# Patient Record
Sex: Female | Born: 1965 | Race: White | Hispanic: No | Marital: Married | Smoking: Former smoker
Health system: Southern US, Community
[De-identification: ages and names within clinical notes are randomized; demographics above are authoritative.]

## PROBLEM LIST (undated history)

## (undated) DIAGNOSIS — E039 Hypothyroidism, unspecified: Secondary | ICD-10-CM

## (undated) DIAGNOSIS — E785 Hyperlipidemia, unspecified: Secondary | ICD-10-CM

## (undated) DIAGNOSIS — K219 Gastro-esophageal reflux disease without esophagitis: Secondary | ICD-10-CM

## (undated) DIAGNOSIS — I459 Conduction disorder, unspecified: Secondary | ICD-10-CM

## (undated) DIAGNOSIS — I441 Atrioventricular block, second degree: Secondary | ICD-10-CM

## (undated) DIAGNOSIS — F32A Depression, unspecified: Secondary | ICD-10-CM

## (undated) HISTORY — PX: OTHER SURGICAL HISTORY: SHX169

## (undated) HISTORY — PX: ABDOMINAL HYSTERECTOMY: SHX81

## (undated) HISTORY — DX: Hyperlipidemia, unspecified: E78.5

## (undated) HISTORY — DX: Atrioventricular block, second degree: I44.1

## (undated) HISTORY — DX: Gastro-esophageal reflux disease without esophagitis: K21.9

## (undated) HISTORY — DX: Depression, unspecified: F32.A

---

## 1999-09-09 ENCOUNTER — Other Ambulatory Visit: Admission: RE | Admit: 1999-09-09 | Discharge: 1999-09-09 | Payer: Self-pay | Admitting: Family Medicine

## 2000-10-24 ENCOUNTER — Other Ambulatory Visit: Admission: RE | Admit: 2000-10-24 | Discharge: 2000-10-24 | Payer: Self-pay | Admitting: Family Medicine

## 2005-10-13 ENCOUNTER — Ambulatory Visit (HOSPITAL_COMMUNITY): Admission: RE | Admit: 2005-10-13 | Discharge: 2005-10-13 | Payer: Self-pay | Admitting: Obstetrics and Gynecology

## 2007-07-23 ENCOUNTER — Ambulatory Visit (HOSPITAL_COMMUNITY): Admission: RE | Admit: 2007-07-23 | Discharge: 2007-07-24 | Payer: Self-pay | Admitting: Obstetrics & Gynecology

## 2007-07-23 ENCOUNTER — Encounter (INDEPENDENT_AMBULATORY_CARE_PROVIDER_SITE_OTHER): Payer: Self-pay | Admitting: Obstetrics & Gynecology

## 2011-03-07 NOTE — Op Note (Signed)
NAMEBROOKS, STOTZ                 ACCOUNT NO.:  [id number]   MEDICAL RECORD NO.:  [id number]          PATIENT TYPE:  AMB   LOCATION:  DAY                          FACILITY:  [facility]   PHYSICIAN:  Genia Del, M.D.DATE OF BIRTH:  07-Oct-1966   DATE OF PROCEDURE:  07/23/2007  DATE OF DISCHARGE:                               OPERATIVE REPORT   PREOPERATIVE DIAGNOSIS:  Left pelvic pain with history of endometriosis  and uterine myoma.   POSTOPERATIVE DIAGNOSIS:  Left pelvic pain with history of endometriosis  and uterine myoma.   PROCEDURE:  Total laparoscopy hysterectomy with left salpingo-  oophorectomy assisted with Da vinci robot.   SURGEON:  Dr. Genia Del.   ASSISTANT:  Dr. Marina Gravel.   PROCEDURE:  Under general anesthesia with endotracheal intubation, the  patient is in lithotomy position.  She is prepped with Betadine on the  abdominal, suprapubic, vulvar and vaginal areas and draped as usual.  The bladder catheter is already in place and the patient just had a  sling procedure done by Dr. Sherron Monday.  We did a vaginal exam which  reveals an anteverted uterus, normal volume, mobile, no adnexal mass.  We insert the weighted speculum and grasped the cervix with the  tenaculum.  The Koh ring with the Rumi are put in place as usual.  We  removed the weighted speculum.  We then go to abdominal time.  We make a  supraumbilical incision with a scalpel over 1.5 cm after infiltrating  with Marcaine 0.25% plain.  We do an open laparoscopy using the Hasson,  the camera is put at that level.  The intra-abdominal and intrapelvic  cavities are inspected.  No adhesion is present.  We have therefore put  the other trocar as usual under direct vision.  Three robotic ports are  put in and an assistant port in a W configuration.  We then dock the  robot and put the instruments in.  A shear scissor is used in the right  arm.  A fenestrated bipolar in the left arm.  The 4th  robotic arm has a  Cobra.  We start at the console.  The ureters are well identified on  both sides and are in normal anatomic position.  We cauterized and  section the left infundibulopelvic ligament.  We then come close to the  uterus.  We cauterize and section the left round ligament and follow it  down the left side of the uterus.  We opened the anterior visceral  peritoneum and reclined the bladder downward.  We cauterize the left  uterine artery.  We then go on the right side.  The right ovary and  tubes are normal to inspection.  The decision was therefore taken to  keep them the patient being premenopausal.  We cauterize and section the  right tube proximally.  We cauterize and sectioned the right utero-  ovarian ligament.  The right round ligament and continued downward on  the right lateral side of the uterus.  We complete the opening of the  visceral anterior peritoneum and bring the bladder  down further.  We  then skeletonized the right uterine artery.  We cauterized it and  section it.  We then go back on the left side and re-cauterize and  sectioned the left uterine artery.  The uterus is blanching.  We then  pushed the [facility] ring further and start anteriorly with the shear  scissors, opening the anterior upper vagina and we go circumferentially  all around the [place] ring to detach the uterus completely.  We then bring  the uterus out through the vagina with the left ovary and tube.  This  specimen is sent to pathology.  We then used the occluder in the vagina  to preserve the pneumoperitoneum.  We change instruments.  We used the  cutting needle driver on the right-hand, the usual needle driver on the  left hand and the fenestrated bipolar in the fourth arm.  We used Vicryl  0 and figure-of-eight to close the vaginal vault.  Closure is good and  hemostasis is adequate.  We irrigate and suction the abdominopelvic  cavities.  We then removed the robotic instruments.  We undocked  the  robot.  We reversed the deep Trendelenburg and make sure that no extra  fluid was present in the pelvic cavity.  A last irrigation and suction  was done.  We then removed all instruments and all ports under direct  vision.  We close the supraumbilical incision with the pursestring  stitch that was already at the aponeurosis attaching that suture.  We  then closed the skin with a Vicryl 4-0 at all incisions except the left  one which was very small and the skin was closed only with Dermabond at  that level.  Dermabond was put on all other skin incisions as well.  We  removed the occluder that was still there vaginally.  Hemostasis is  adequate.  The estimated blood loss was 50 mL.  No complications  occurred and the patient was brought to recovery room in good stable  status.  Note that she has received antibiotic prophylaxis at the  beginning of the case.      Genia Del, M.D.  Electronically Signed     ML/MEDQ  D:  07/23/2007  T:  07/23/2007  Job:  [id number]

## 2011-03-07 NOTE — Op Note (Signed)
Judith Clark, Judith Clark                 ACCOUNT NO.:  [id number]   MEDICAL RECORD NO.:  [id number]          PATIENT TYPE:  AMB   LOCATION:  DAY                          [facility]   PHYSICIAN:  Martina Sinner, MD DATE OF BIRTH:  [birth year]   DATE OF PROCEDURE:  07/23/2007  DATE OF DISCHARGE:                               OPERATIVE REPORT   PREOPERATIVE DIAGNOSIS:  Stress incontinence.   POSTOPERATIVE DIAGNOSIS:  Stress incontinence.   PROCEDURE:  Sling cystourethropexy (SPARC plus cystoscopy).   Judith Clark has mixed stress and urge incontinence.  She consented to a  sling cystourethropexy.   The patient was prepped and draped for a robotic-assisted hysterectomy.  Extra care was taken with leg positioning to minimize the risk of  compartment syndrome, neuropathy, and DVT.   I initially made two 1-cm incisions 1.5 cm lateral to the midline one  fingerbreadth above the symphysis pubis.  I made a 2-cm incision  underlying the midurethra.  I used 7 mL of a lidocaine epinephrine  mixture.  I made suburethral incision with a thick pubocervical flap and  dissected with Strully scissors to the urethrovesical angle bilaterally.   With the bladder emptied, I passed the [facility] needle on top of and along  the back of the symphysis pubis parallel to the midline on the pulp of  my index finger bilaterally.  I then cystoscoped the patient.  There was  no entrance or injury to the bladder or urethra.  There was efflux of  indigo carmine from both ureteral orifices.  There was no deflection of  the bladder when I wiggled the SPARC needles from side to side.   With the bladder empty, I attached the [facility] sling and brought it up  through the retropubic space.  I cut below the blue dots and removed the  sheath.  I was very happy with the tension and position of the sling.  There was nice hypermobility in the midline as well as laterally.  There  was no spring back effect.   After  irrigating, I closed the anterior vaginal wall incision with  running 2-0 Vicryl on a CT-1 needle followed by 2 interrupted sutures.  I cut the mesh sling below the skin level and closed the skin with 4-0  Vicryl after irrigating.  I then used Dermabond.   Total blood loss was less than 30 mL.  The procedure was well-tolerated.  A light packing was placed while Dr. Seymour Bars was preparing for the  robotic-assisted hysterectomy.   Judith Clark will be followed as per protocol.           ______________________________  Martina Sinner, MD  Electronically Signed     SAM/MEDQ  D:  07/23/2007  T:  07/23/2007  Job:  [id number]

## 2011-03-10 NOTE — H&P (Signed)
NAMEEMER, ONNEN                 ACCOUNT NO.:  [id number]   MEDICAL RECORD NO.:  [id number]          PATIENT TYPE:  AMB   LOCATION:  SDC                           FACILITY:  [facility]   PHYSICIAN:  Richardean Sale, M.D.   DATE OF BIRTH:  09-Feb-1966   DATE OF ADMISSION:  10/13/2005  DATE OF DISCHARGE:                                HISTORY & PHYSICAL   PREOPERATIVE DIAGNOSES:  Pelvic pain.   HISTORY OF PRESENT ILLNESS:  This is a 45 year old gravida 3, para 2 white  female who has a history of progressively worsening pain with menses and  with intercourse.  Patient also has a questionable history of interstitial  cystitis and she presents today for surgical evaluation.  Patient's symptoms  are such that she now has pain throughout the majority days of the month  with only five days of the month that she does not have some sort of lower  pelvic discomfort.  Pain is significantly worsened with her menses as well  as with intercourse and she is no longer able to tolerate sexual activity.  She has been tried on oral contraceptive pills on the past, but discontinued  these secondary to side effects and she has declined an empiric trial of  Lupron.  She presents today for diagnostic laparoscopy as well as cystoscopy  and hydrodistention to be performed by Dr. McDiarmid to evaluate for  interstitial cystitis.   PAST OBSTETRIC HISTORY:  Vaginal delivery x2.   PAST SURGICAL HISTORY:  Bilateral tubal ligation.   PAST MEDICAL HISTORY:  Urinary incontinence, possible interstitial cystitis  and history of depression.   PAST GYNECOLOGICAL HISTORY:  No history of abnormal Pap smears or sexually  transmitted infections.  Menses are regular beginning approximately age 89,  last four days with medium flow.  No intramenstrual bleeding.   FAMILY HISTORY:  Positive for hypertension in her mother and grandmother.  Mother had cervical cancer.  No breast, ovarian, uterine, or colon cancer.   SOCIAL  HISTORY:  Remote history of marijuana.  Denies any tobacco, alcohol,  or illicit drug use presently.   MEDICATIONS:  Aciphex, Enablex.   ALLERGIES:  MOLD.   PHYSICAL EXAMINATION:  VITAL SIGNS:  She is afebrile.  Vital signs are  stable.  Weight 138.  Height 5 feet 3-1/2 inches.  GENERAL:  She is a well-developed, well-nourished white female who is in no  acute distress.  HEART:  Regular rate and rhythm.  LUNGS:  Clear to auscultation bilaterally.  ABDOMEN:  Soft, nontender, nondistended with no palpable masses.  Liver and  spleen are normal.  There is no hernia.  EXTREMITIES:  No clubbing, cyanosis, edema.  NEUROLOGIC:  Nonfocal.  PELVIC:  External genitalia are within normal limits without lesions.  The  vagina is pink, moist, and rugated with good support.  Cervix:  Parous with  no lesions.  No cervical motion tenderness.  Uterus is anteverted, midline,  mobile with no masses.  Adnexa:  No masses, but mildly tender to palpation  over both adnexa.  No uterosacral nodularity palpated.  Pelvic ultrasound  shows  uterus with two small uterine fibroids maximum diameter 2.5 cm.  No  submucosal or subserosal component.   ASSESSMENT:  45 year old gravida 3, para 2-0-0-2 white female with  progressively worsening pelvic pain, dysmenorrhea, and dyspareunia as well  as urinary incontinence.   PLAN:  Will proceed with diagnostic laparoscopy and possible fulguration of  endometriosis should endometriosis be identified at the time of procedure.  In addition, Dr. McDiarmid will perform cystoscopy and bladder  hydrodistention to evaluate for interstitial cystitis.  I have reviewed with  the patient that operative findings at the time of this procedure may be  entirely within normal limits and may not have an explanation for her pelvic  pain.  I reviewed the risks, benefits, and alternatives of the procedure  with the patient in detail.  We have discussed risks which include, but are  not  limited to, hemorrhage requiring transfusion, infection, injury to the  bowel, the bladder, the ureters or other organs which could require  additional surgery either at the time of this procedure or in the future,  reviewed the risks of anesthesia-related complications including DVT,  pulmonary embolus, and even death.  Patient voices understanding of all  these above risks and desires to proceed.  Informed consent has been  obtained.      Richardean Sale, M.D.  Electronically Signed     JW/MEDQ  D:  10/12/2005  T:  10/13/2005  Job:  [id number]

## 2011-03-10 NOTE — Op Note (Signed)
Judith Clark, Judith Clark                 ACCOUNT NO.:  [id number]   MEDICAL RECORD NO.:  [id number]          PATIENT TYPE:  AMB   LOCATION:  SDC                           FACILITY:  [facility]   PHYSICIAN:  Richardean Sale, M.D.   DATE OF BIRTH:  1966-02-03   DATE OF PROCEDURE:  10/13/2005  DATE OF DISCHARGE:                                 OPERATIVE REPORT   PREOPERATIVE DIAGNOSES:  1.  Pelvic pain.  2.  Dysmenorrhea.  3.  Dyspareunia.   POSTOPERATIVE DIAGNOSES:  1.  Pelvic pain.  2.  Dysmenorrhea.  3.  Dyspareunia.  4.  Endometriosis.   OPERATION/PROCEDURE:  Diagnostic laparoscopy with fulguration of  endometriosis.   SURGEON:  Richardean Sale, M.D.   ASSISTANT:  None.   COMPLICATIONS:  None.   ESTIMATED BLOOD LOSS:  Minimal.   ANESTHESIA:  General.   SURGICAL FINDINGS:  Endometrial implants of the left uterosacral ligament  with moderate amount of scarring.  Normal-appearing ovaries bilaterally.  Normal fallopian tubes bilaterally with evidence of prior tubal ligation,  normal appendix, normal liver.  No endometrial implants in the posterior cul-  de-sac.  Single endometrial implant in the anterior cul-de-sac.   PATHOLOGY SPECIMENS:  None.   INDICATIONS:  This is a 44 year old gravida 3, para 2-0-0-2 white female who  has a history of progressively worsening pelvic pain, dysmenorrhea and  dyspareunia.  The patient has been tried on oral contraceptive pills but has  been unable to tolerate them secondary to side effects.  The patient  presents today for diagnostic laparoscopy.  In addition, the patient has had  trouble with frequent urination and has been evaluated by urology for  possible interstitial cystitis.  During today's procedure, Dr. McDiarmid is  planning cystoscopy with hydrodistention to evaluate for IC.  Prior to this  procedure, the risks, benefits and alternatives of laparoscopy were reviewed  with the patient in detail.  We discussed risks which include but are  not  limited to hemorrhage requiring transfusion, infection, injury to the bowel  or the bladder, the ureters or other organs that could require additional  surgery either at the time of this procedure or in the future.  I reviewed  risks of DVT, pulmonary embolus, and anesthesia-related complications.  The  patient voiced understanding of all the above and desired to proceed.  In  addition, I discussed with the patient that there is a possibility that  surgical findings may be entirely within normal limits or that endometriosis  may be encountered and if endometriosis is encountered, the plans today  would be for destruction of any endometrial implants with electrocautery  fulguration.  The patient voiced understanding of the above and desires to  proceed.  Informed consent was obtained before proceeding to the OR.   DESCRIPTION OF PROCEDURE:  The patient was taken to the operating room where  she was given a general anesthetic. She was then prepped and draped in the  usual sterile fashion with Betadine and Dr. McDiarmid performed his portion  of the procedure.  Once Dr. McDiarmid had completed his procedure, I entered  the room.  The abdomen was then prepped and draped and the pelvis was  reprepped.  Sterile drapes were applied.  Bimanual exam was performed.  The  uterus was midline, mobile with no obvious masses.  The adnexa was were  mobile with no masses.  Foley catheter was then placed.  The speculum was  then placed in the vagina and the [place] tenaculum was then introduced.  The  speculum was then removed and attention was then turned to the patient's  abdomen.  Plain Marcaine 0.5%, 5 mL, injected into the infraumbilical area  and an infraumbilical 10 mm skin incision was made with the scalpel.  This  was carried down to the fascia.  The fascia was then grasped between two  Kocher clamps and the fascia was then incised with Mayo scissors.  The  peritoneum was then identified and  grasped with two hemostats and was  incised.  The fascial incision was then secured with the pursestring Vicryl  suture and the Hasson trocar and sleeve were introduced.  The laparoscope  was then introduced and confirmed intra-abdominal placement.  CO2 gas was  then allowed to insufflate.  A second incision was made in the patient's  suprapubic area 2 cm above the pubic symphysis in the midline.  This was a 5  mm incision.  Marcaine was used in the subcutaneous tissue approximately 2  mL prior to making the incision.  Through this incision the 5 mm trocar and  sleeve were introduced directly.  A blunt probe was then introduced and the  pelvis was explored with the findings noted above.  Abnormal areas included  the left uterosacral ligaments and a small endometrial implant in the  anterior cul-de-sac.  At this point a third port was then made in the  patient's left lower quadrant in an area that was free of the inferior  epigastric vessels.  The bipolar cautery was then introduced and the  endometrial implants were fulgurated.  The ureter was noted to be coursing  in its normal anatomic position and was at least 2 cm away from the  endometrial implant on the left uterosacral ligament.  At that point once  fulguration had been completed, the pelvis was inspected.  There was no  evidence of any bleeding.  Plain Marcaine 0.5%, 10 mL, introduced into the  peritoneal cavity over the areas that were cauterized for an additional  postoperative pain relief and the procedure was then terminated.  CO2 gas  was allowed to escape.  The 5 mm trocars were removed under direct  visualization and the laparoscope and Hasson trocar were removed.  The  umbilical fascial incision was then closed by tying the previously placed  pursestring Vicryl suture and the skin of the umbilical incision was closed  with a 4-0 Vicryl in subcuticular fashion and the two other incisions were closed with Dermabond.  The [place]ka  tenaculum was removed from the patient's  cervix and the Foley catheter was removed.  The patient tolerated the  procedure very well. There were no complications.  All sponge, lap, and  needle counts were correct x2.  She was taken to the recovery room awake and  in stable condition.      Richardean Sale, M.D.  Electronically Signed     JW/MEDQ  D:  10/13/2005  T:  10/14/2005  Job:  [id number]

## 2011-03-10 NOTE — Op Note (Signed)
NAMESHALAUNDA, WEATHERHOLTZ                 ACCOUNT NO.:  [id number]   MEDICAL RECORD NO.:  [id number]          PATIENT TYPE:  AMB   LOCATION:  SDC                           FACILITY:  [facility]   PHYSICIAN:  Martina Sinner, MD DATE OF BIRTH:  May 10, 1966   DATE OF PROCEDURE:  10/13/2005  DATE OF DISCHARGE:                                 OPERATIVE REPORT   PREOPERATIVE DIAGNOSIS:  Pelvic pain.   POSTOPERATIVE DIAGNOSIS:  Pelvic pain.   OPERATION/PROCEDURE:  1.  Cystoscopy.  2.  Bladder hydrodistention.   INDICATIONS:  Mrs. Judith Clark has pelvic pain. She is going to have urodynamics on  January 15.  She also has marked frequency and mild mixed stress and urge  incontinence.  We are trying to sort out whether or not her pain is  gynecological or urologic.  She consented to a cystoscopy and  hydrodistention and laparoscopy by the Dr. Annabell Howells.   DESCRIPTION OF PROCEDURE:  Under general anesthesia, the patient was prepped  and draped in the usual fashion.  She was given IV ciprofloxacin prior to  the procedure.  She was hydrodistended to approximately 800 mL and pressor  of approximately 80 cm of water.  Sterile water was utilized.  Initially the  bladder mucosa and trigone were normal.  I emptied the bladder.  I  reinspected it twice and she had no glomerulations or finding in keeping  with a diagnosis of interstitial cystitis.  The bladder was emptied.  She  will be covered with antibiotics postoperatively, given Phenergan as well as  pain medications.           ______________________________  Martina Sinner, MD  Electronically Signed     SAM/MEDQ  D:  10/13/2005  T:  10/14/2005  Job:  [id number]

## 2011-08-03 LAB — PROTIME-INR: Prothrombin Time: 13.8

## 2011-08-03 LAB — COMPREHENSIVE METABOLIC PANEL
ALT: 34
AST: 23
Albumin: 3.9
Alkaline Phosphatase: 67
Glucose, Bld: 109 — ABNORMAL HIGH
Potassium: 3.7
Sodium: 141
Total Protein: 7

## 2011-08-03 LAB — TYPE AND SCREEN: Antibody Screen: NEGATIVE

## 2011-08-03 LAB — PREGNANCY, URINE: Preg Test, Ur: NEGATIVE

## 2011-08-03 LAB — CBC
Hemoglobin: 12
MCV: 85.1
Platelets: 275
RBC: 4.16
RDW: 14
WBC: 16.5 — ABNORMAL HIGH

## 2011-08-03 LAB — ABO/RH: ABO/RH(D): B POS

## 2011-12-02 ENCOUNTER — Encounter (HOSPITAL_COMMUNITY): Payer: Self-pay | Admitting: *Deleted

## 2011-12-02 ENCOUNTER — Emergency Department (HOSPITAL_COMMUNITY)
Admission: EM | Admit: 2011-12-02 | Discharge: 2011-12-02 | Disposition: A | Discharge status: Home / Self Care | Payer: Self-pay | Source: Home / Self Care | Attending: Emergency Medicine | Admitting: Emergency Medicine

## 2011-12-02 DIAGNOSIS — N390 Urinary tract infection, site not specified: Secondary | ICD-10-CM

## 2011-12-02 DIAGNOSIS — R109 Unspecified abdominal pain: Secondary | ICD-10-CM

## 2011-12-02 DIAGNOSIS — E039 Hypothyroidism, unspecified: Secondary | ICD-10-CM | POA: Insufficient documentation

## 2011-12-02 HISTORY — DX: Hypothyroidism, unspecified: E03.9

## 2011-12-02 LAB — POCT URINALYSIS DIP (DEVICE)
Bilirubin Urine: NEGATIVE
Glucose, UA: NEGATIVE mg/dL
Nitrite: NEGATIVE

## 2011-12-02 MED ORDER — CEFTRIAXONE SODIUM 1 G IJ SOLR
1.0000 g | Freq: Once | INTRAMUSCULAR | Status: AC
  Administered 2011-12-02: 1 g via INTRAMUSCULAR

## 2011-12-02 MED ORDER — LIDOCAINE HCL (PF) 1 % IJ SOLN
INTRAMUSCULAR | Status: AC
  Filled 2011-12-02: qty 5

## 2011-12-02 MED ORDER — CEFTRIAXONE SODIUM 1 G IJ SOLR: 1.0000 g | Freq: Once | INTRAMUSCULAR | Status: AC

## 2011-12-02 MED ORDER — CEFTRIAXONE SODIUM 1 G IJ SOLR
INTRAMUSCULAR | Status: AC
  Filled 2011-12-02: qty 10

## 2011-12-02 MED ORDER — CEPHALEXIN 500 MG PO CAPS: 500.0000 mg | ORAL_CAPSULE | Freq: Four times a day (QID) | ORAL | Status: AC

## 2011-12-02 NOTE — ED Notes (Signed)
Co burning with urination along with urgency x 1 wk, has been taking uricalm, otc, starting yesterday with rash to arms, legs, buttocks area with itching and burning at rash site, states this is only new med she has taken

## 2011-12-02 NOTE — ED Provider Notes (Signed)
History     CSN: [id number]  Arrival date & time 12/02/11  0908   First MD Initiated Contact with Patient 12/02/11 [phone]      Chief Complaint  Patient presents with  . Urinary Tract Infection    (Consider location/radiation/quality/duration/timing/severity/associated sxs/prior treatment) HPI Comments: Judith Clark presents to urgent care complaining of burning with urination along with urgency for about a week try you her account over-the-counter with no improvement and believes suspects that secondarily to that developed a rash both of her wrists and ankle area and outer aspect of the toes itching and burning seem to be subsiding now.  Patient also describes that she vomited one or 2 times during the course of the week. Perhaps perhaps unaware if she has perceive any fevers. Describe some suprapubic discomfort.  Patient is a 46 y.o. female presenting with urinary tract infection. The history is provided by the patient.  Urinary Tract Infection The current episode started more than 1 week ago. The problem occurs constantly. The problem has been gradually worsening. Associated symptoms include abdominal pain. Pertinent negatives include no shortness of breath.    Past Medical History  Diagnosis Date  . Hypothyroidism   . Hypothyroidism     Past Surgical History  Procedure Date  . Abdominal hysterectomy   . Bladder tacking     History reviewed. No pertinent family history.  History  Substance Use Topics  . Smoking status: Never Smoker   . Smokeless tobacco: Not on file  . Alcohol Use: Yes    OB History    Grav Para Term Preterm Abortions TAB SAB Ect Mult Living   3 3              Review of Systems  Constitutional: Positive for fever, chills, activity change, appetite change and fatigue.  Respiratory: Negative for shortness of breath.   Gastrointestinal: Positive for abdominal pain.  Genitourinary: Positive for dysuria, urgency, flank pain, difficulty urinating and pelvic  pain. Negative for hematuria and vaginal pain.    Allergies  Review of patient's allergies indicates no known allergies.  Home Medications   Current Outpatient Rx  Name Route Sig Dispense Refill  . LEVOTHYROXINE SODIUM 50 MCG PO TABS Oral Take 50 mcg by mouth daily.    Marland Kitchen CEFTRIAXONE SODIUM 1 G IJ SOLR Intramuscular Inject 1 g into the muscle once. 1 each 0  . CEPHALEXIN 500 MG PO CAPS Oral Take 1 capsule (500 mg total) by mouth 4 (four) times daily. 40 capsule 0    BP 128/82  Pulse 98  Temp(Src) 97.7 F (36.5 C) (Oral)  Resp 20  Physical Exam  Constitutional: She appears well-developed and well-nourished. No distress.  HENT:  Head: Normocephalic.  Neck: Neck supple.  Abdominal: Soft. There is tenderness. There is CVA tenderness. There is no rebound and no guarding.    Skin: No rash noted. No erythema.    ED Course  Procedures (including critical care time)  Labs Reviewed  POCT URINALYSIS DIP (DEVICE) - Abnormal; Notable for the following:    Hgb urine dipstick TRACE (*)    Leukocytes, UA LARGE (*) Biochemical Testing Only. Please order routine urinalysis from main lab if confirmatory testing is needed.   All other components within normal limits  POCT PREGNANCY, URINE  URINE CULTURE   No results found.   1. Urinary tract infection   2. Flank pain       MDM  Patient looks comfortable with normal signs and afebrile describe symptoms for  approximately one week described on my review of system that she vomited once or twice and during exam left flank pain was reproducible along with suprapubic tenderness. Suspect patient might be an early stage of [place] [employer] she also took about 3 doses URI CALM and described developed a bilateral wrist routed it every ERY THEMATOUS type rash that now seem to have faded completely similar rash to both pretibial areas. Will provide patient with aggressive is antibiotic dose while at [facility] with ceftriaxone so has been administered in a  course of Keflex for 10 days. Urine cultures have been ordered today patient has been clearly instructed that if temperatures above 100.4 further vomiting constant or exacerbated left flank pain despite 3 or 4 doses of Keflex to go to the emergency department for further evaluation and treatment of Pyelonephritis        Jimmie Molly, MD 12/02/11 1217

## 2011-12-03 LAB — URINE CULTURE
Culture  Setup Time: 201302091709
Special Requests: NORMAL

## 2012-06-04 ENCOUNTER — Other Ambulatory Visit (HOSPITAL_COMMUNITY): Payer: Self-pay | Admitting: Family Medicine

## 2012-06-04 DIAGNOSIS — R1011 Right upper quadrant pain: Secondary | ICD-10-CM

## 2012-06-05 ENCOUNTER — Ambulatory Visit (HOSPITAL_COMMUNITY)
Admission: RE | Admit: 2012-06-05 | Discharge: 2012-06-05 | Disposition: A | Discharge status: Home / Self Care | Payer: 59 | Source: Ambulatory Visit | Attending: Family Medicine | Admitting: Family Medicine

## 2012-06-05 DIAGNOSIS — R110 Nausea: Secondary | ICD-10-CM | POA: Insufficient documentation

## 2012-06-05 DIAGNOSIS — R1011 Right upper quadrant pain: Secondary | ICD-10-CM

## 2012-07-16 ENCOUNTER — Other Ambulatory Visit (HOSPITAL_COMMUNITY): Payer: Self-pay | Admitting: Family Medicine

## 2012-07-16 DIAGNOSIS — R109 Unspecified abdominal pain: Secondary | ICD-10-CM

## 2012-08-07 ENCOUNTER — Encounter (HOSPITAL_COMMUNITY): Payer: 59

## 2012-08-16 ENCOUNTER — Ambulatory Visit (HOSPITAL_COMMUNITY)
Admission: RE | Admit: 2012-08-16 | Discharge: 2012-08-16 | Disposition: A | Discharge status: Home / Self Care | Payer: 59 | Source: Ambulatory Visit | Attending: Family Medicine | Admitting: Family Medicine

## 2012-08-16 DIAGNOSIS — R112 Nausea with vomiting, unspecified: Secondary | ICD-10-CM | POA: Insufficient documentation

## 2012-08-16 DIAGNOSIS — R1011 Right upper quadrant pain: Secondary | ICD-10-CM | POA: Insufficient documentation

## 2012-08-16 DIAGNOSIS — R109 Unspecified abdominal pain: Secondary | ICD-10-CM

## 2012-08-16 MED ORDER — SINCALIDE 5 MCG IJ SOLR
INTRAMUSCULAR | Status: AC
  Administered 2012-08-16: 3.4 ug via INTRAVENOUS
  Filled 2012-08-16: qty 5

## 2012-08-16 MED ORDER — TECHNETIUM TC 99M MEBROFENIN IV KIT
5.0000 | PACK | Freq: Once | INTRAVENOUS | Status: AC | PRN
  Administered 2012-08-16: 5 via INTRAVENOUS

## 2012-08-16 MED ORDER — SINCALIDE 5 MCG IJ SOLR
0.0200 ug/kg | Freq: Once | INTRAMUSCULAR | Status: AC
  Administered 2012-08-16: 3.4 ug via INTRAVENOUS

## 2013-07-05 IMAGING — NM NM HEPATO W/GB/PHARM/[PERSON_NAME]
4 series · 19 of 19 positions shown · non-contrast
Comparison: none

CLINICAL DATA: Right upper abdominal pain, nausea.  Negative
gallbladder ultrasound.

[he hepatobiliary · 3.43mm/px · 6 of 30 frames shown (1 of 4)]
[frame 3/30]
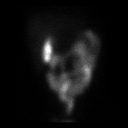
[frame 8/30]
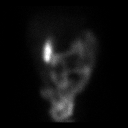
[frame 13/30]
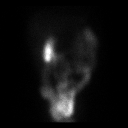
[frame 18/30]
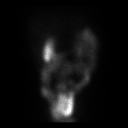
[frame 23/30]
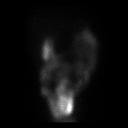
[frame 28/30]
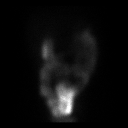

[he hepatobiliary · 3.43mm/px · 6 of 8 frames shown (2 of 4)]
[frame 1/8]
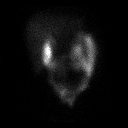
[frame 2/8]
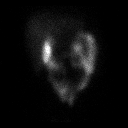
[frame 4/8]
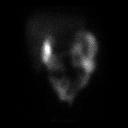
[frame 5/8]
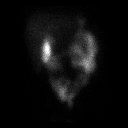
[frame 6/8]
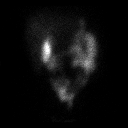
[frame 8/8]
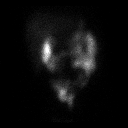

[he hepatobiliary · 3.43mm/px · 6 of 52 frames shown (3 of 4)]
[frame 5/52]
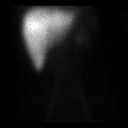
[frame 13/52]
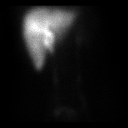
[frame 22/52]
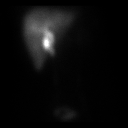
[frame 31/52]
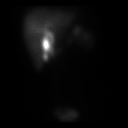
[frame 39/52]
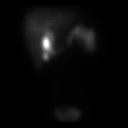
[frame 48/52]
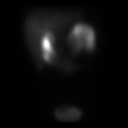

[he hepatobiliary · 1 of 1 slices shown (4 of 4)]
[im 1/1]
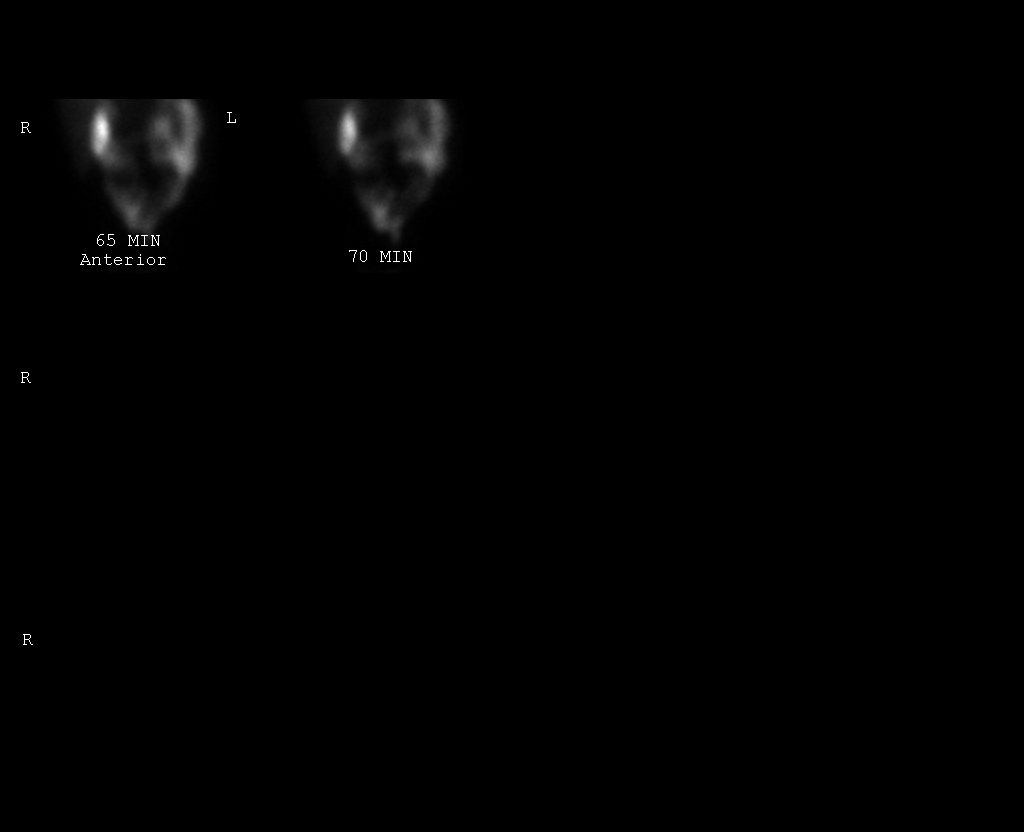

[19 of 19 positions shown; findings below may reference images not displayed]

HEPATOBILIARY SCINTIGRAPHY WITH EJECTION FRACTION

Anterior imaging afterQ.UmXi CcAA3 Choletec IV. There is prompt
clearance of the radiopharmaceutical from the blood pool. Timely
visualization of activity in central bile ducts, small bowel, and
gallbladder.
After 1 hour,1.4 mcg CCK was infused intravenously and imaging
continued. The patient describes mild pain with infusion. The
calculated gallbladder ejection fraction over 30 minutes is 52.6%
[(normal >30% at  thirty minutes (Ziessman et al., 9330 J. Nuclear
Med).]

IMPRESSION
1. Patency of cystic and common bile ducts.
2. Normal gallbladder ejection fraction.

## 2014-08-24 ENCOUNTER — Encounter (HOSPITAL_COMMUNITY): Payer: Self-pay | Admitting: *Deleted

## 2016-06-08 ENCOUNTER — Emergency Department (HOSPITAL_COMMUNITY): Payer: 59

## 2016-06-08 ENCOUNTER — Emergency Department (HOSPITAL_COMMUNITY)
Admission: EM | Admit: 2016-06-08 | Discharge: 2016-06-08 | Disposition: A | Discharge status: Home / Self Care | Payer: 59 | Attending: Emergency Medicine | Admitting: Emergency Medicine

## 2016-06-08 ENCOUNTER — Encounter (HOSPITAL_COMMUNITY): Payer: Self-pay | Admitting: Emergency Medicine

## 2016-06-08 DIAGNOSIS — Z79891 Long term (current) use of opiate analgesic: Secondary | ICD-10-CM | POA: Diagnosis not present

## 2016-06-08 DIAGNOSIS — E039 Hypothyroidism, unspecified: Secondary | ICD-10-CM | POA: Insufficient documentation

## 2016-06-08 DIAGNOSIS — R1011 Right upper quadrant pain: Secondary | ICD-10-CM | POA: Diagnosis present

## 2016-06-08 DIAGNOSIS — R112 Nausea with vomiting, unspecified: Secondary | ICD-10-CM | POA: Insufficient documentation

## 2016-06-08 DIAGNOSIS — Z79899 Other long term (current) drug therapy: Secondary | ICD-10-CM | POA: Insufficient documentation

## 2016-06-08 HISTORY — DX: Conduction disorder, unspecified: I45.9

## 2016-06-08 LAB — COMPREHENSIVE METABOLIC PANEL
ALT: 36 U/L (ref 14–54)
ANION GAP: 8 (ref 5–15)
AST: 24 U/L (ref 15–41)
Albumin: 4.4 g/dL (ref 3.5–5.0)
Alkaline Phosphatase: 71 U/L (ref 38–126)
BUN: 14 mg/dL (ref 6–20)
CALCIUM: 9.3 mg/dL (ref 8.9–10.3)
CHLORIDE: 107 mmol/L (ref 101–111)
CO2: 22 mmol/L (ref 22–32)
Creatinine, Ser: 0.9 mg/dL (ref 0.44–1.00)
Glucose, Bld: 94 mg/dL (ref 65–99)
Potassium: 3.4 mmol/L — ABNORMAL LOW (ref 3.5–5.1)
SODIUM: 137 mmol/L (ref 135–145)
Total Bilirubin: 0.6 mg/dL (ref 0.3–1.2)
Total Protein: 8 g/dL (ref 6.5–8.1)

## 2016-06-08 LAB — CBC WITH DIFFERENTIAL/PLATELET
BASOS ABS: 0 10*3/uL (ref 0.0–0.1)
Basophils Relative: 0 %
EOS ABS: 0.3 10*3/uL (ref 0.0–0.7)
EOS PCT: 2 %
HCT: 37.9 % (ref 36.0–46.0)
HEMOGLOBIN: 12.3 g/dL (ref 12.0–15.0)
LYMPHS ABS: 4 10*3/uL (ref 0.7–4.0)
LYMPHS PCT: 29 %
MCH: 27.3 pg (ref 26.0–34.0)
MCHC: 32.5 g/dL (ref 30.0–36.0)
MCV: 84 fL (ref 78.0–100.0)
Monocytes Absolute: 1 10*3/uL (ref 0.1–1.0)
Monocytes Relative: 7 %
NEUTROS PCT: 62 %
Neutro Abs: 8.3 10*3/uL — ABNORMAL HIGH (ref 1.7–7.7)
PLATELETS: 364 10*3/uL (ref 150–400)
RBC: 4.51 MIL/uL (ref 3.87–5.11)
RDW: 14.2 % (ref 11.5–15.5)
WBC: 13.6 10*3/uL — AB (ref 4.0–10.5)

## 2016-06-08 LAB — LIPASE, BLOOD: LIPASE: 35 U/L (ref 11–51)

## 2016-06-08 LAB — URINALYSIS, ROUTINE W REFLEX MICROSCOPIC
Bilirubin Urine: NEGATIVE
Glucose, UA: NEGATIVE mg/dL
HGB URINE DIPSTICK: NEGATIVE
Ketones, ur: NEGATIVE mg/dL
LEUKOCYTES UA: NEGATIVE
NITRITE: NEGATIVE
PROTEIN: NEGATIVE mg/dL
SPECIFIC GRAVITY, URINE: 1.008 (ref 1.005–1.030)
pH: 6.5 (ref 5.0–8.0)

## 2016-06-08 MED ORDER — BARIUM SULFATE 2.1 % PO SUSP
900.0000 mL | ORAL | Status: AC
  Administered 2016-06-08: 900 mL via ORAL

## 2016-06-08 MED ORDER — MORPHINE SULFATE (PF) 2 MG/ML IV SOLN
2.0000 mg | Freq: Once | INTRAVENOUS | Status: DC
  Filled 2016-06-08: qty 1

## 2016-06-08 MED ORDER — SODIUM CHLORIDE 0.9 % IV SOLN
1000.0000 mL | Freq: Once | INTRAVENOUS | Status: AC
  Administered 2016-06-08: 1000 mL via INTRAVENOUS

## 2016-06-08 MED ORDER — SODIUM CHLORIDE 0.9 % IV SOLN
1000.0000 mL | INTRAVENOUS | Status: DC
  Administered 2016-06-08: 1000 mL via INTRAVENOUS

## 2016-06-08 MED ORDER — ONDANSETRON 4 MG PO TBDP: 4.0000 mg | ORAL_TABLET | ORAL | 0 refills | Status: DC | PRN

## 2016-06-08 MED ORDER — FAMOTIDINE 20 MG PO TABS: 20.0000 mg | ORAL_TABLET | Freq: Two times a day (BID) | ORAL | 0 refills | Status: AC

## 2016-06-08 MED ORDER — ONDANSETRON HCL 4 MG/2ML IJ SOLN
4.0000 mg | Freq: Once | INTRAMUSCULAR | Status: AC
  Administered 2016-06-08: 4 mg via INTRAVENOUS
  Filled 2016-06-08: qty 2

## 2016-06-08 MED ORDER — FAMOTIDINE IN NACL 20-0.9 MG/50ML-% IV SOLN
20.0000 mg | Freq: Once | INTRAVENOUS | Status: AC
  Administered 2016-06-08: 20 mg via INTRAVENOUS
  Filled 2016-06-08: qty 50

## 2016-06-08 NOTE — ED Provider Notes (Signed)
[facility]-EMERGENCY DEPT Provider Note   CSN: [id number] Arrival date & time: 06/08/16  1325     History   Chief Complaint Chief Complaint  Patient presents with  . Abdominal Pain    HPI Judith Clark is a 50 y.o. female.  HPI Patient reports she has right upper quadrant pain that started 6 days ago. She reports is been waxing and waning severity. At times is been intensely severe. She has noted radiation up towards her right shoulder and neck intermittently. She also receives pain radiating into her back. Patient suspects this is gallbladder problems. She reports that she woke up in the middle the night Monday vomiting copiously. She also developed some very loose stool that was green in quality. Those symptoms have now abated. The patient has not had vomiting and diarrhea today. She has perceived chills and subjective fever. Patient does report a history of severe gastric reflux. Past Medical History:  Diagnosis Date  . Heart block   . Hypothyroidism   . Hypothyroidism     Patient Active Problem List   Diagnosis Date Noted  . Hypothyroidism     Past Surgical History:  Procedure Laterality Date  . ABDOMINAL HYSTERECTOMY    . bladder tacking      OB History    Gravida Para Term Preterm AB Living   3 3           SAB TAB Ectopic Multiple Live Births                   Home Medications    Prior to Admission medications   Medication Sig Start Date End Date Taking? Authorizing Provider  acetaminophen (TYLENOL) 500 MG tablet Take 1,000 mg by mouth every 6 (six) hours as needed for mild pain, moderate pain or headache.   Yes Historical Provider, MD  calcium carbonate (TUMS - DOSED IN MG ELEMENTAL CALCIUM) 500 MG chewable tablet Chew 1 tablet by mouth daily as needed for indigestion or heartburn.   Yes Historical Provider, MD  Multiple Vitamins-Minerals (MULTIVITAMIN ADULT PO) Take 1 tablet by mouth daily.   Yes Historical Provider, MD  SYNTHROID 75 MCG tablet Take 75 mcg by  mouth daily. 05/24/16  Yes Historical Provider, MD  famotidine (PEPCID) 20 MG tablet Take 1 tablet (20 mg total) by mouth 2 (two) times daily. 06/08/16   Arby BarretteMarcy Jashaun Penrose, MD  ondansetron (ZOFRAN ODT) 4 MG disintegrating tablet Take 1 tablet (4 mg total) by mouth every 4 (four) hours as needed for nausea or vomiting. 06/08/16   Arby BarretteMarcy Kariel Skillman, MD    Family History History reviewed. No pertinent family history.  Social History Social History  Substance Use Topics  . Smoking status: Never Smoker  . Smokeless tobacco: Never Used  . Alcohol use Yes     Allergies   Codeine; Contrast media [iodinated diagnostic agents]; and Shellfish allergy   Review of Systems Review of Systems   Physical Exam Updated Vital Signs BP 126/70 (BP Location: Left Arm)   Pulse 86   Temp 98.7 F (37.1 C) (Oral)   Resp 18   SpO2 100%   Physical Exam   ED Treatments / Results  Labs (all labs ordered are listed, but only abnormal results are displayed) Labs Reviewed  COMPREHENSIVE METABOLIC PANEL - Abnormal; Notable for the following:       Result Value   Potassium 3.4 (*)    All other components within normal limits  CBC WITH DIFFERENTIAL/PLATELET - Abnormal; Notable for the  following:    WBC 13.6 (*)    Neutro Abs 8.3 (*)    All other components within normal limits  LIPASE, BLOOD  URINALYSIS, ROUTINE W REFLEX MICROSCOPIC (NOT AT [facility])    EKG  EKG Interpretation None       Radiology Ct Abdomen Pelvis Wo Contrast  Result Date: 06/08/2016 CLINICAL DATA:  Right upper quadrant abdominal pain beginning 5 days ago. Symptoms are intermittent. EXAM: CT ABDOMEN AND PELVIS WITHOUT CONTRAST TECHNIQUE: Multidetector CT imaging of the abdomen and pelvis was performed following the standard protocol without IV contrast. COMPARISON:  Nuclear medicine hepatobiliary study 08/16/2012. Abdominal ultrasound 06/05/2012. FINDINGS: Lower chest: The lung bases are clear without focal nodule, mass, or airspace  disease. The heart size is normal. No significant pleural or pericardial effusion is present. Hepatobiliary: No focal up attic lesions are present. Liver contour is within normal limits. The common bile duct and gallbladder are normal. Pancreas: No significant inflammatory changes are present. No significant solid or cystic mass lesion is present. There is no significant ductal dilation. Spleen: The spleen is within normal limits. Adrenals/Urinary Tract: The adrenal glands are normal bilaterally. The kidneys and ureters are within normal limits. There is no stone or obstruction. The urinary bladder is within normal limits. Stomach/Bowel: The stomach and duodenum are within normal limits. Small bowel is unremarkable. Contrast is seen into the colon. The appendix is visualized and within normal limits. The ascending transverse colon are normal. The descending and rectosigmoid colon are within normal limits. Vascular/Lymphatic: No significant vascular calcifications are present. There is no significant adenopathy. Reproductive: Hysterectomy is noted. The adnexa are within normal limits for age. Other: No significant free fluid or free air is present. Musculoskeletal: A vacuum disc present at L5-S1. Mild facet hypertrophy is present at the same level. Vertebral body heights and alignment are otherwise normal. IMPRESSION: 1. No acute or focal abnormality to explain the patient's right upper quadrant abdominal pain. 2. Hysterectomy. 3. 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 Electronically Signed   By: Marin Roberts M.D.   On: 06/08/2016 19:06    Procedures Procedures (including critical care time)  Medications Ordered in ED Medications  0.9 %  sodium chloride infusion (0 mLs Intravenous Stopped 06/08/16 1718)    Followed by  0.9 %  sodium chloride infusion (1,000 mLs Intravenous New Bag/Given 06/08/16 1554)  morphine 2 MG/ML injection 2 mg (2 mg Intravenous Refused 06/08/16 1942)    famotidine (PEPCID) IVPB 20 mg premix (0 mg Intravenous Stopped 06/08/16 1718)  ondansetron (ZOFRAN) injection 4 mg (4 mg Intravenous Given 06/08/16 1555)  Barium Sulfate 2.1 % SUSP 900 mL (900 mLs Oral Given 06/08/16 1600)     Initial Impression / Assessment and Plan / ED Course  I have reviewed the triage vital signs and the nursing notes.  Pertinent labs & imaging results that were available during my care of the patient were reviewed by me and considered in my medical decision making (see chart for details).  Clinical Course    Final Clinical Impressions(s) / ED Diagnoses   Final diagnoses:  Right upper quadrant pain  Non-intractable vomiting with nausea, vomiting of unspecified type   At this time, diagnostic workup does not reveal etiology for the patient's symptoms. Plan will be to proceed with gallbladder diet. The symptoms are particularly episodic right upper quadrant pain. Patient also however reports a very strong history of reflux. She has been very reluctant to use any PPIs because  she feels they give her diarrhea and bad side effect profile. She has only been taking Pepcid once a day and then eating Tums. The patient is counseled to start Pepcid twice daily on a regular schedule. She will be given Zofran for nausea. She is in the follow-up with her primary care physician beginning of the week to determine if symptoms aren't improving or if she needs to be scheduled for hydroscan and possibly GI referral for endoscopy's. She is counseled on signs and symptoms for short term.  New Prescriptions New Prescriptions   FAMOTIDINE (PEPCID) 20 MG TABLET    Take 1 tablet (20 mg total) by mouth 2 (two) times daily.   ONDANSETRON (ZOFRAN ODT) 4 MG DISINTEGRATING TABLET    Take 1 tablet (4 mg total) by mouth every 4 (four) hours as needed for nausea or vomiting.     Arby BarretteMarcy Natthew Marlatt, MD 06/08/16 [phone]

## 2016-06-08 NOTE — ED Triage Notes (Addendum)
Pt reports RUQ abd pain that started Saturday. Pain gets better and worse at times. Also having abd swelling. Went to PCP who referred her here for further eval. Labs done at PCP

## 2016-06-08 NOTE — Discharge Instructions (Signed)
Return to the emergency department if he develops fever, persistent vomiting, worsening pain or other concerning symptoms. Follow-up with your doctor the beginning of the week to continue evaluating her symptoms. You may need referral to gastroenterology for endoscopy's. Also you may need a hydascan. Follow the instructions for gallbladder diet.

## 2016-06-08 NOTE — ED Notes (Signed)
Attempted to call ED CT again w/o answer.  Spoke to [facility] and they are contacting the CT techs.

## 2016-06-08 NOTE — ED Notes (Signed)
Pt has completed Barium.  Attempted to call CT w/o answer.

## 2016-06-12 ENCOUNTER — Other Ambulatory Visit (HOSPITAL_COMMUNITY): Payer: Self-pay | Admitting: Family Medicine

## 2016-06-12 DIAGNOSIS — R1011 Right upper quadrant pain: Secondary | ICD-10-CM

## 2016-12-12 ENCOUNTER — Other Ambulatory Visit: Payer: Self-pay | Admitting: Family Medicine

## 2016-12-12 DIAGNOSIS — Z1231 Encounter for screening mammogram for malignant neoplasm of breast: Secondary | ICD-10-CM

## 2016-12-14 ENCOUNTER — Other Ambulatory Visit: Payer: Self-pay | Admitting: Family Medicine

## 2016-12-14 DIAGNOSIS — N644 Mastodynia: Secondary | ICD-10-CM

## 2016-12-20 ENCOUNTER — Other Ambulatory Visit: Payer: Self-pay | Admitting: Family Medicine

## 2016-12-20 DIAGNOSIS — N644 Mastodynia: Secondary | ICD-10-CM

## 2016-12-28 ENCOUNTER — Ambulatory Visit
Admission: RE | Admit: 2016-12-28 | Discharge: 2016-12-28 | Disposition: A | Discharge status: Home / Self Care | Payer: 59 | Source: Ambulatory Visit | Attending: Family Medicine | Admitting: Family Medicine

## 2016-12-28 DIAGNOSIS — N644 Mastodynia: Secondary | ICD-10-CM

## 2016-12-28 DIAGNOSIS — R928 Other abnormal and inconclusive findings on diagnostic imaging of breast: Secondary | ICD-10-CM | POA: Diagnosis not present

## 2016-12-28 DIAGNOSIS — N6489 Other specified disorders of breast: Secondary | ICD-10-CM | POA: Diagnosis not present

## 2017-01-18 DIAGNOSIS — Z0000 Encounter for general adult medical examination without abnormal findings: Secondary | ICD-10-CM | POA: Diagnosis not present

## 2017-01-29 DIAGNOSIS — M542 Cervicalgia: Secondary | ICD-10-CM | POA: Diagnosis not present

## 2017-01-29 DIAGNOSIS — M9902 Segmental and somatic dysfunction of thoracic region: Secondary | ICD-10-CM | POA: Diagnosis not present

## 2017-01-29 DIAGNOSIS — M9901 Segmental and somatic dysfunction of cervical region: Secondary | ICD-10-CM | POA: Diagnosis not present

## 2017-02-08 DIAGNOSIS — M9901 Segmental and somatic dysfunction of cervical region: Secondary | ICD-10-CM | POA: Diagnosis not present

## 2017-02-08 DIAGNOSIS — M542 Cervicalgia: Secondary | ICD-10-CM | POA: Diagnosis not present

## 2017-02-08 DIAGNOSIS — M5382 Other specified dorsopathies, cervical region: Secondary | ICD-10-CM | POA: Diagnosis not present

## 2017-02-16 DIAGNOSIS — M5382 Other specified dorsopathies, cervical region: Secondary | ICD-10-CM | POA: Diagnosis not present

## 2017-02-16 DIAGNOSIS — M9901 Segmental and somatic dysfunction of cervical region: Secondary | ICD-10-CM | POA: Diagnosis not present

## 2017-02-16 DIAGNOSIS — M542 Cervicalgia: Secondary | ICD-10-CM | POA: Diagnosis not present

## 2017-02-22 DIAGNOSIS — M9901 Segmental and somatic dysfunction of cervical region: Secondary | ICD-10-CM | POA: Diagnosis not present

## 2017-02-22 DIAGNOSIS — M5382 Other specified dorsopathies, cervical region: Secondary | ICD-10-CM | POA: Diagnosis not present

## 2017-02-22 DIAGNOSIS — M542 Cervicalgia: Secondary | ICD-10-CM | POA: Diagnosis not present

## 2017-03-01 DIAGNOSIS — M5382 Other specified dorsopathies, cervical region: Secondary | ICD-10-CM | POA: Diagnosis not present

## 2017-03-01 DIAGNOSIS — M9901 Segmental and somatic dysfunction of cervical region: Secondary | ICD-10-CM | POA: Diagnosis not present

## 2017-03-01 DIAGNOSIS — M542 Cervicalgia: Secondary | ICD-10-CM | POA: Diagnosis not present

## 2017-03-16 DIAGNOSIS — M9901 Segmental and somatic dysfunction of cervical region: Secondary | ICD-10-CM | POA: Diagnosis not present

## 2017-03-16 DIAGNOSIS — M5382 Other specified dorsopathies, cervical region: Secondary | ICD-10-CM | POA: Diagnosis not present

## 2017-03-16 DIAGNOSIS — M542 Cervicalgia: Secondary | ICD-10-CM | POA: Diagnosis not present

## 2017-03-29 DIAGNOSIS — M542 Cervicalgia: Secondary | ICD-10-CM | POA: Diagnosis not present

## 2017-03-29 DIAGNOSIS — M9901 Segmental and somatic dysfunction of cervical region: Secondary | ICD-10-CM | POA: Diagnosis not present

## 2017-03-29 DIAGNOSIS — M5382 Other specified dorsopathies, cervical region: Secondary | ICD-10-CM | POA: Diagnosis not present

## 2017-05-03 DIAGNOSIS — M542 Cervicalgia: Secondary | ICD-10-CM | POA: Diagnosis not present

## 2017-05-03 DIAGNOSIS — M9901 Segmental and somatic dysfunction of cervical region: Secondary | ICD-10-CM | POA: Diagnosis not present

## 2017-05-03 DIAGNOSIS — M5382 Other specified dorsopathies, cervical region: Secondary | ICD-10-CM | POA: Diagnosis not present

## 2017-06-28 DIAGNOSIS — Z1211 Encounter for screening for malignant neoplasm of colon: Secondary | ICD-10-CM | POA: Diagnosis not present

## 2017-06-28 DIAGNOSIS — D72829 Elevated white blood cell count, unspecified: Secondary | ICD-10-CM | POA: Diagnosis not present

## 2017-06-28 DIAGNOSIS — E039 Hypothyroidism, unspecified: Secondary | ICD-10-CM | POA: Diagnosis not present

## 2018-07-11 DIAGNOSIS — E782 Mixed hyperlipidemia: Secondary | ICD-10-CM | POA: Diagnosis not present

## 2018-07-11 DIAGNOSIS — E039 Hypothyroidism, unspecified: Secondary | ICD-10-CM | POA: Diagnosis not present

## 2018-07-11 DIAGNOSIS — E559 Vitamin D deficiency, unspecified: Secondary | ICD-10-CM | POA: Diagnosis not present

## 2018-07-11 DIAGNOSIS — R232 Flushing: Secondary | ICD-10-CM | POA: Diagnosis not present

## 2018-11-07 DIAGNOSIS — J101 Influenza due to other identified influenza virus with other respiratory manifestations: Secondary | ICD-10-CM | POA: Diagnosis not present

## 2018-11-07 DIAGNOSIS — R509 Fever, unspecified: Secondary | ICD-10-CM | POA: Diagnosis not present

## 2021-02-22 ENCOUNTER — Other Ambulatory Visit: Payer: Self-pay

## 2021-02-22 ENCOUNTER — Encounter: Payer: Self-pay | Admitting: Family Medicine

## 2021-02-22 ENCOUNTER — Ambulatory Visit: Payer: 59 | Admitting: Family Medicine

## 2021-02-22 VITALS — BP 112/74 | HR 90 | Resp 18 | Ht 64.0 in | Wt 177.6 lb

## 2021-02-22 DIAGNOSIS — E039 Hypothyroidism, unspecified: Secondary | ICD-10-CM

## 2021-02-22 DIAGNOSIS — K219 Gastro-esophageal reflux disease without esophagitis: Secondary | ICD-10-CM | POA: Diagnosis not present

## 2021-02-22 DIAGNOSIS — E1169 Type 2 diabetes mellitus with other specified complication: Secondary | ICD-10-CM | POA: Insufficient documentation

## 2021-02-22 DIAGNOSIS — E782 Mixed hyperlipidemia: Secondary | ICD-10-CM | POA: Insufficient documentation

## 2021-02-22 DIAGNOSIS — Z6830 Body mass index (BMI) 30.0-30.9, adult: Secondary | ICD-10-CM | POA: Insufficient documentation

## 2021-02-22 DIAGNOSIS — E669 Obesity, unspecified: Secondary | ICD-10-CM | POA: Insufficient documentation

## 2021-02-22 DIAGNOSIS — E6609 Other obesity due to excess calories: Secondary | ICD-10-CM | POA: Insufficient documentation

## 2021-02-22 DIAGNOSIS — K9041 Non-celiac gluten sensitivity: Secondary | ICD-10-CM | POA: Diagnosis not present

## 2021-02-22 DIAGNOSIS — E559 Vitamin D deficiency, unspecified: Secondary | ICD-10-CM | POA: Insufficient documentation

## 2021-02-22 NOTE — Progress Notes (Signed)
Subjective:  Patient ID: Judith Clark, female    DOB: [birth year]  Age: 55 y.o. MRN: [id number]  Chief Complaint  Patient presents with  . Establish Care    HPI Hyperlipidemia: GERD: On famotidine 20 mg one twice days. If eats and lays down it causes worsening gerd.  Hypothyroidism: on Synthroid 75 mg once daily. Depression: previously on lexapro in 2021. Had covid 19 shot [facility]) In December pt became very sick. Her pcr and rapid test were negative. Her husband had positive covid 19 and became very sick and was hospitalized for 10 days.  There is concern about celiac disease...saw Dr. Adella Hare. Put on SIBO diet. Saw her 4-5 yrs ago. Has gluten intolerance. Follows a gluten free diet. SIBO diet - plain, bland.    Current Outpatient Medications on File Prior to Visit  Medication Sig Dispense Refill  . levothyroxine (SYNTHROID) 50 MCG tablet TAKE 1 TABLET IN THE MORNING ON AN EMPTY STOMACH EVERY OTHER DAY ALTERNATING WITH 75 MCG    . acetaminophen (TYLENOL) 500 MG tablet Take 1,000 mg by mouth every 6 (six) hours as needed for mild pain, moderate pain or headache.    . calcium carbonate (TUMS - DOSED IN MG ELEMENTAL CALCIUM) 500 MG chewable tablet Chew 1 tablet by mouth daily as needed for indigestion or heartburn.    . famotidine (PEPCID) 20 MG tablet Take 1 tablet (20 mg total) by mouth 2 (two) times daily. 30 tablet 0  . Multiple Vitamins-Minerals (MULTIVITAMIN ADULT PO) Take 1 tablet by mouth daily.    . ondansetron (ZOFRAN ODT) 4 MG disintegrating tablet Take 1 tablet (4 mg total) by mouth every 4 (four) hours as needed for nausea or vomiting. 20 tablet 0  . SYNTHROID 75 MCG tablet Take 75 mcg by mouth daily.     No current facility-administered medications on file prior to visit.   Past Medical History:  Diagnosis Date  . Depression   . GERD (gastroesophageal reflux disease)   . Hyperlipidemia   . Hypothyroidism   . Mobitz type 1 second degree  atrioventricular block    unknown if Mobitz 1 or 2.   Past Surgical History:  Procedure Laterality Date  . ABDOMINAL HYSTERECTOMY    . bladder tacking      Family History  Problem Relation Age of Onset  . Breast cancer Mother   . Hypertension Mother   . Diabetes Father    Social History   Socioeconomic History  . Marital status: Married    Spouse name: Not on file  . Number of children: Not on file  . Years of education: Not on file  . Highest education level: Not on file  Occupational History  . Occupation: Charity fundraiser    Comment: [employer]  Tobacco Use  . Smoking status: Never Smoker  . Smokeless tobacco: Never Used  Vaping Use  . Vaping Use: Never used  Substance and Sexual Activity  . Alcohol use: Not Currently  . Drug use: Never  . Sexual activity: Yes    Birth control/protection: Post-menopausal  Other Topics Concern  . Not on file  Social History Narrative  . Not on file   Social Determinants of Health   Financial Resource Strain: Not on file  Food Insecurity: Not on file  Transportation Needs: Not on file  Physical Activity: Not on file  Stress: Not on file  Social Connections: Not on file    Review of Systems  Constitutional: Positive for  diaphoresis (menopause). Negative for chills, fatigue and fever.  HENT: Negative for congestion, ear pain and sore throat.   Respiratory: Negative for cough and shortness of breath.   Cardiovascular: Negative for chest pain and palpitations (has history of mobitz).  Gastrointestinal: Negative for abdominal pain, constipation, diarrhea, nausea and vomiting.  Genitourinary: Negative for dysuria and urgency.  Musculoskeletal: Negative for arthralgias and myalgias.  Skin: Negative for rash.  Neurological: Negative for dizziness and headaches.  Psychiatric/Behavioral: Negative for dysphoric mood. The patient is not nervous/anxious.      Objective:  BP 112/74   Pulse 90   Resp 18   Ht 5\' 4"  (1.626 m)   Wt  177 lb 9.6 oz (80.6 kg)   SpO2 98%   BMI 30.48 kg/m   BP/Weight 02/22/2021 06/08/2016 12/02/2011  Systolic BP 112 126 128  Diastolic BP 74 70 82  Wt. (Lbs) 177.6 - -  BMI 30.48 - -    Physical Exam Vitals reviewed.  Constitutional:      Appearance: Normal appearance. She is normal weight.  Neck:     Vascular: No carotid bruit.  Cardiovascular:     Rate and Rhythm: Normal rate and regular rhythm.     Pulses: Normal pulses.     Heart sounds: Normal heart sounds.  Pulmonary:     Effort: Pulmonary effort is normal. No respiratory distress.     Breath sounds: Normal breath sounds.  Abdominal:     General: Abdomen is flat. Bowel sounds are normal.     Palpations: Abdomen is soft.     Tenderness: There is no abdominal tenderness.  Neurological:     Mental Status: She is alert and oriented to person, place, and time.  Psychiatric:        Mood and Affect: Mood normal.        Behavior: Behavior normal.     Diabetic Foot Exam - Simple   No data filed      Lab Results  Component Value Date   WBC 13.6 (H) 06/08/2016   HGB 12.3 06/08/2016   HCT 37.9 06/08/2016   PLT 364 06/08/2016   GLUCOSE 94 06/08/2016   ALT 36 06/08/2016   AST 24 06/08/2016   NA 137 06/08/2016   K 3.4 (L) 06/08/2016   CL 107 06/08/2016   CREATININE 0.90 06/08/2016   BUN 14 06/08/2016   CO2 22 06/08/2016   INR 1.0 07/19/2007      Assessment & Plan:   1. Gastroesophageal reflux disease without esophagitis The current medical regimen is effective;  continue present plan and medications.  2. Acquired hypothyroidism The current medical regimen is effective;  continue present plan and medications.  3. Gluten intolerance Mgmt per GI.   Follow-up: Return in about 4 months (around 06/27/2021) for fasting.  An After Visit Summary was printed and given to the patient.  08/27/2021, MD [facility] [phone]  I,Katherina A Bramblett,acting as a scribe for [phone], MD.,have documented  all relevant documentation on the behalf of Blane Ohara, MD,as directed by  Blane Ohara, MD while in the presence of Blane Ohara, MD.

## 2021-02-24 ENCOUNTER — Ambulatory Visit: Payer: 59 | Admitting: Family Medicine

## 2021-03-02 ENCOUNTER — Encounter: Payer: Self-pay | Admitting: Family Medicine

## 2021-03-04 ENCOUNTER — Encounter: Payer: Self-pay | Admitting: Family Medicine

## 2021-06-06 ENCOUNTER — Other Ambulatory Visit: Payer: Self-pay

## 2021-06-06 MED ORDER — LEVOTHYROXINE SODIUM 50 MCG PO TABS: ORAL_TABLET | ORAL | 0 refills | Status: DC

## 2021-06-06 NOTE — Telephone Encounter (Signed)
Pt is requesting short supply until appointment.   Terrill Mohr 06/06/21 3:29 PM

## 2021-06-22 NOTE — Progress Notes (Addendum)
Subjective:  Patient ID: Judith Clark, female    DOB: 1966/09/12  Age: 55 y.o. MRN: [id number]  Chief Complaint  Patient presents with   Hyperlipidemia   Gastroesophageal Reflux    HPI GERD- pepcid 20 mg BID. Thyroid-Alternates between synthroid 50 mg and 75 mg. Have some palpitations and constipation. Taking gluten free metamucil tablets. Using apple cider vinegar. Just started these. Constantly feels bloated. Drinks plenty of water and getting lots of fiber.   Current Outpatient Medications on File Prior to Visit  Medication Sig Dispense Refill   acetaminophen (TYLENOL) 500 MG tablet Take 1,000 mg by mouth every 6 (six) hours as needed for mild pain, moderate pain or headache.     calcium carbonate (TUMS - DOSED IN MG ELEMENTAL CALCIUM) 500 MG chewable tablet Chew 1 tablet by mouth daily as needed for indigestion or heartburn.     famotidine (PEPCID) 20 MG tablet Take 1 tablet (20 mg total) by mouth 2 (two) times daily. 30 tablet 0   levothyroxine (SYNTHROID) 50 MCG tablet TAKE 1 TABLET IN THE MORNING ON AN EMPTY STOMACH EVERY OTHER DAY ALTERNATING WITH 75 MCG 30 tablet 0   Multiple Vitamins-Minerals (MULTIVITAMIN ADULT PO) Take 1 tablet by mouth daily.     ondansetron (ZOFRAN ODT) 4 MG disintegrating tablet Take 1 tablet (4 mg total) by mouth every 4 (four) hours as needed for nausea or vomiting. 20 tablet 0   SYNTHROID 75 MCG tablet Take 75 mcg by mouth daily.     No current facility-administered medications on file prior to visit.   Past Medical History:  Diagnosis Date   Depression    GERD (gastroesophageal reflux disease)    Hyperlipidemia    Hypothyroidism    Mobitz type 1 second degree atrioventricular block    unknown if Mobitz 1 or 2.   Past Surgical History:  Procedure Laterality Date   ABDOMINAL HYSTERECTOMY     bladder tacking      Family History  Problem Relation Age of Onset   Breast cancer Mother    Hypertension Mother    Diabetes Father    Social  History   Socioeconomic History   Marital status: Married    Spouse name: Not on file   Number of children: Not on file   Years of education: Not on file   Highest education level: Not on file  Occupational History   Occupation: Charity fundraiser    Comment: [employer]  Tobacco Use   Smoking status: Never   Smokeless tobacco: Never  Vaping Use   Vaping Use: Never used  Substance and Sexual Activity   Alcohol use: Not Currently   Drug use: Never   Sexual activity: Yes    Birth control/protection: Post-menopausal  Other Topics Concern   Not on file  Social History Narrative   Not on file   Social Determinants of Health   Financial Resource Strain: Not on file  Food Insecurity: Not on file  Transportation Needs: Not on file  Physical Activity: Not on file  Stress: Not on file  Social Connections: Not on file    Review of Systems  Constitutional:  Negative for chills, fatigue and fever.  HENT:  Negative for congestion, ear pain, rhinorrhea and sore throat.   Respiratory:  Negative for cough and shortness of breath.   Cardiovascular:  Positive for palpitations. Negative for chest pain.  Gastrointestinal:  Positive for constipation. Negative for abdominal pain, diarrhea, nausea and vomiting.  Genitourinary:  Negative  for dysuria and urgency.  Musculoskeletal:  Positive for arthralgias, back pain and myalgias.  Skin:        dry  Neurological:  Negative for dizziness, weakness, light-headedness and headaches.  Psychiatric/Behavioral:  Negative for dysphoric mood. The patient is not nervous/anxious.     Objective:  BP 124/72   Pulse 90   Temp (!) 97.1 F (36.2 C)   Ht 5\' 4"  (1.626 m)   Wt 179 lb (81.2 kg)   SpO2 99%   BMI 30.73 kg/m   BP/Weight 06/23/2021 02/22/2021 06/08/2016  Systolic BP 124 112 126  Diastolic BP 72 74 70  Wt. (Lbs) 179 177.6 -  BMI 30.73 30.48 -    Physical Exam Vitals reviewed.  Constitutional:      Appearance: Normal appearance. She is  normal weight.  Neck:     Vascular: No carotid bruit.  Cardiovascular:     Rate and Rhythm: Normal rate and regular rhythm.     Pulses: Normal pulses.     Heart sounds: Normal heart sounds.  Pulmonary:     Effort: Pulmonary effort is normal. No respiratory distress.     Breath sounds: Normal breath sounds.  Abdominal:     General: Abdomen is flat. Bowel sounds are normal.     Palpations: Abdomen is soft.     Tenderness: There is no abdominal tenderness.  Neurological:     Mental Status: She is alert and oriented to person, place, and time.  Psychiatric:        Mood and Affect: Mood normal.        Behavior: Behavior normal.    Diabetic Foot Exam - Simple   No data filed      Lab Results  Component Value Date   WBC 7.5 06/23/2021   HGB 13.2 06/23/2021   HCT 39.9 06/23/2021   PLT 326 06/23/2021   GLUCOSE 109 (H) 06/23/2021   CHOL 219 (H) 06/23/2021   TRIG 92 06/23/2021   HDL 43 06/23/2021   LDLCALC 160 (H) 06/23/2021   ALT 21 06/23/2021   AST 19 06/23/2021   NA 138 06/23/2021   K 4.6 06/23/2021   CL 104 06/23/2021   CREATININE 0.97 06/23/2021   BUN 16 06/23/2021   CO2 22 06/23/2021   TSH 0.762 06/23/2021   INR 1.0 07/19/2007   HGBA1C 6.0 (H) 06/23/2021      Assessment & Plan:   1. Gastroesophageal reflux disease without esophagitis Continue pepcid bid.   2. Acquired hypothyroidism The current medical regimen is effective;  continue present plan and medications.  - TSH  3. Mixed hyperlipidemia Very high. Recommend statin (crestor 10 mg once daily) Continue to work on eating a healthy diet and exercise.  Labs drawn today.  - CBC with Differential/Platelet - Lipid panel - Comprehensive metabolic panel  4. Chronic idiopathic constipation Start linzess 145 mg once daily.   5. Palpitations Check tsh.  EKG: Normal sinus rhythm no ST changes.  6. Prediabetes Recommend metformin 500 mg once daily. - Hemoglobin A1c   Meds ordered this encounter   Medications   linaclotide (LINZESS) 145 MCG CAPS capsule    Sig: Take 1 capsule (145 mcg total) by mouth daily before breakfast.    Dispense:  90 capsule    Refill:  3     Orders Placed This Encounter  Procedures   CBC with Differential/Platelet   Lipid panel   Comprehensive metabolic panel   TSH   Hemoglobin A1c   Cardiovascular Risk  Assessment      Follow-up: Return in about 6 weeks (around 08/04/2021) for CPE not fasting.  An After Visit Summary was printed and given to the patient.  Blane Ohara, MD [facility] [phone]

## 2021-06-23 ENCOUNTER — Other Ambulatory Visit: Payer: Self-pay

## 2021-06-23 ENCOUNTER — Ambulatory Visit: Payer: 59 | Admitting: Family Medicine

## 2021-06-23 ENCOUNTER — Encounter: Payer: Self-pay | Admitting: Family Medicine

## 2021-06-23 VITALS — BP 124/72 | HR 90 | Temp 97.1°F | Ht 64.0 in | Wt 179.0 lb

## 2021-06-23 DIAGNOSIS — K5904 Chronic idiopathic constipation: Secondary | ICD-10-CM | POA: Diagnosis not present

## 2021-06-23 DIAGNOSIS — E039 Hypothyroidism, unspecified: Secondary | ICD-10-CM | POA: Diagnosis not present

## 2021-06-23 DIAGNOSIS — R002 Palpitations: Secondary | ICD-10-CM

## 2021-06-23 DIAGNOSIS — K219 Gastro-esophageal reflux disease without esophagitis: Secondary | ICD-10-CM

## 2021-06-23 DIAGNOSIS — E782 Mixed hyperlipidemia: Secondary | ICD-10-CM | POA: Diagnosis not present

## 2021-06-23 DIAGNOSIS — R7303 Prediabetes: Secondary | ICD-10-CM

## 2021-06-23 MED ORDER — LINACLOTIDE 145 MCG PO CAPS: 145.0000 ug | ORAL_CAPSULE | Freq: Every day | ORAL | 3 refills | Status: DC

## 2021-06-23 NOTE — Patient Instructions (Signed)
Linzess 145 mg once daily in am.

## 2021-06-24 LAB — CBC WITH DIFFERENTIAL/PLATELET
Basophils Absolute: 0.1 10*3/uL (ref 0.0–0.2)
Basos: 1 %
EOS (ABSOLUTE): 0.2 10*3/uL (ref 0.0–0.4)
Eos: 3 %
Hematocrit: 39.9 % (ref 34.0–46.6)
Hemoglobin: 13.2 g/dL (ref 11.1–15.9)
Immature Grans (Abs): 0 10*3/uL (ref 0.0–0.1)
Immature Granulocytes: 0 %
Lymphocytes Absolute: 2.6 10*3/uL (ref 0.7–3.1)
Lymphs: 34 %
MCH: 28 pg (ref 26.6–33.0)
MCHC: 33.1 g/dL (ref 31.5–35.7)
MCV: 85 fL (ref 79–97)
Monocytes Absolute: 0.6 10*3/uL (ref 0.1–0.9)
Monocytes: 8 %
Neutrophils Absolute: 4.1 10*3/uL (ref 1.4–7.0)
Neutrophils: 54 %
Platelets: 326 10*3/uL (ref 150–450)
RBC: 4.71 x10E6/uL (ref 3.77–5.28)
RDW: 13.3 % (ref 11.7–15.4)
WBC: 7.5 10*3/uL (ref 3.4–10.8)

## 2021-06-24 LAB — COMPREHENSIVE METABOLIC PANEL
ALT: 21 IU/L (ref 0–32)
AST: 19 IU/L (ref 0–40)
Albumin/Globulin Ratio: 2 (ref 1.2–2.2)
Albumin: 4.5 g/dL (ref 3.8–4.9)
Alkaline Phosphatase: 90 IU/L (ref 44–121)
BUN/Creatinine Ratio: 16 (ref 9–23)
BUN: 16 mg/dL (ref 6–24)
Bilirubin Total: 0.3 mg/dL (ref 0.0–1.2)
CO2: 22 mmol/L (ref 20–29)
Calcium: 9.3 mg/dL (ref 8.7–10.2)
Chloride: 104 mmol/L (ref 96–106)
Creatinine, Ser: 0.97 mg/dL (ref 0.57–1.00)
Globulin, Total: 2.2 g/dL (ref 1.5–4.5)
Glucose: 109 mg/dL — ABNORMAL HIGH (ref 65–99)
Potassium: 4.6 mmol/L (ref 3.5–5.2)
Sodium: 138 mmol/L (ref 134–144)
Total Protein: 6.7 g/dL (ref 6.0–8.5)
eGFR: 69 mL/min/{1.73_m2} (ref >59)

## 2021-06-24 LAB — HEMOGLOBIN A1C
Est. average glucose Bld gHb Est-mCnc: 126 mg/dL
Hgb A1c MFr Bld: 6 % — ABNORMAL HIGH (ref 4.8–5.6)

## 2021-06-24 LAB — LIPID PANEL
Chol/HDL Ratio: 5.1 ratio — ABNORMAL HIGH (ref 0.0–4.4)
Cholesterol, Total: 219 mg/dL — ABNORMAL HIGH (ref 100–199)
HDL: 43 mg/dL (ref >39)
LDL Chol Calc (NIH): 160 mg/dL — ABNORMAL HIGH (ref 0–99)
Triglycerides: 92 mg/dL (ref 0–149)
VLDL Cholesterol Cal: 16 mg/dL (ref 5–40)

## 2021-06-24 LAB — CARDIOVASCULAR RISK ASSESSMENT

## 2021-06-24 LAB — TSH: TSH: 0.762 u[IU]/mL (ref 0.450–4.500)

## 2021-06-27 ENCOUNTER — Other Ambulatory Visit: Payer: Self-pay | Admitting: Family Medicine

## 2021-06-27 NOTE — Addendum Note (Signed)
Addended byBlane Ohara on: 06/27/2021 07:52 PM   Modules accepted: Orders

## 2021-06-28 ENCOUNTER — Other Ambulatory Visit: Payer: Self-pay

## 2021-06-28 MED ORDER — LEVOTHYROXINE SODIUM 50 MCG PO TABS: ORAL_TABLET | ORAL | 3 refills | Status: DC

## 2021-06-29 ENCOUNTER — Other Ambulatory Visit: Payer: Self-pay

## 2021-06-29 MED ORDER — ROSUVASTATIN CALCIUM 10 MG PO TABS: 10.0000 mg | ORAL_TABLET | Freq: Every day | ORAL | 1 refills | Status: DC

## 2021-06-29 MED ORDER — METFORMIN HCL 500 MG PO TABS: 500.0000 mg | ORAL_TABLET | Freq: Every day | ORAL | 1 refills | Status: DC

## 2021-07-05 ENCOUNTER — Other Ambulatory Visit: Payer: Self-pay

## 2021-07-05 DIAGNOSIS — K5904 Chronic idiopathic constipation: Secondary | ICD-10-CM

## 2021-07-05 NOTE — Telephone Encounter (Signed)
Patient also wants to know if she can take "GoLo" a dietary supplement for weight loss reports she has taken in the past.

## 2021-07-06 NOTE — Telephone Encounter (Signed)
Please see if she would like linzess 145 mg once daily.  I do not think golo would hurt anything, but not sure it will help. My understanding is it is apple cider vinegar gummies. kc

## 2021-07-14 ENCOUNTER — Ambulatory Visit: Payer: 59

## 2021-07-14 DIAGNOSIS — Z23 Encounter for immunization: Secondary | ICD-10-CM

## 2021-07-14 NOTE — Progress Notes (Signed)
   Covid-19 Vaccination Clinic  Name:  Judith Clark    MRN: [id number] DOB: [birth year]  07/14/2021  [name] was observed post Covid-19 immunization for 15 minutes without incident. She was provided with Vaccine Information Sheet and instruction to access the V-Safe system.   [name] was instructed to call 911 with any severe reactions post vaccine: Difficulty breathing  Swelling of face and throat  A fast heartbeat  A bad rash all over body  Dizziness and weakness

## 2021-07-14 NOTE — Telephone Encounter (Signed)
Pt came into office as she was upset she had not heard anything. Pt does not want to take 145 mcg of linzess. She wants to take 72 mcg of linzess once daily. Requests this be sent to [facility] 2. Will send to provider for approval.   Lorita Officer, CCMA 07/14/21 10:15 AM

## 2021-07-27 LAB — HM MAMMOGRAPHY

## 2021-07-28 ENCOUNTER — Other Ambulatory Visit: Payer: Self-pay

## 2021-07-28 MED ORDER — SYNTHROID 75 MCG PO TABS: 75.0000 ug | ORAL_TABLET | Freq: Every day | ORAL | 0 refills | Status: DC

## 2021-08-03 ENCOUNTER — Ambulatory Visit (INDEPENDENT_AMBULATORY_CARE_PROVIDER_SITE_OTHER): Payer: 59 | Admitting: Family Medicine

## 2021-08-03 ENCOUNTER — Other Ambulatory Visit: Payer: Self-pay

## 2021-08-03 ENCOUNTER — Encounter: Payer: Self-pay | Admitting: Family Medicine

## 2021-08-03 VITALS — BP 130/60 | HR 96 | Temp 97.8°F | Resp 15 | Ht 64.0 in | Wt 177.0 lb

## 2021-08-03 DIAGNOSIS — K5904 Chronic idiopathic constipation: Secondary | ICD-10-CM | POA: Diagnosis not present

## 2021-08-03 DIAGNOSIS — Z90710 Acquired absence of both cervix and uterus: Secondary | ICD-10-CM | POA: Diagnosis not present

## 2021-08-03 DIAGNOSIS — Z0000 Encounter for general adult medical examination without abnormal findings: Secondary | ICD-10-CM

## 2021-08-03 DIAGNOSIS — E6609 Other obesity due to excess calories: Secondary | ICD-10-CM | POA: Diagnosis not present

## 2021-08-03 DIAGNOSIS — Z6830 Body mass index (BMI) 30.0-30.9, adult: Secondary | ICD-10-CM

## 2021-08-03 MED ORDER — LEVOTHYROXINE SODIUM 50 MCG PO TABS: ORAL_TABLET | ORAL | 3 refills | Status: DC

## 2021-08-03 MED ORDER — LEVOTHYROXINE SODIUM 75 MCG PO TABS: ORAL_TABLET | ORAL | 3 refills | Status: DC

## 2021-08-03 NOTE — Progress Notes (Signed)
Subjective:  Patient ID: Judith Clark, female    DOB: 06-24-1966  Age: 55 y.o. MRN: [id number]  Chief Complaint  Patient presents with   Annual Exam    HPI   Well Adult Physical: Patient here for a comprehensive physical exam.The patient reports no problems Do you take any herbs or supplements that were not prescribed by a doctor? no Are you taking calcium supplements? yes Are you taking aspirin daily? no  Encounter for general adult medical examination without abnormal findings  Physical ("At Risk" items are starred): Patient's last physical exam was 1 year ago .  Patient is not afflicted from Stress Incontinence and Urge Incontinence  Patient wears a seat belt, has smoke detectors, has carbon monoxide detectors, practices appropriate gun safety, and wears sunscreen with extended sun exposure. Dental Care: biannual cleanings, brushes and flosses daily. Ophthalmology/Optometry: Annual visit.  Hearing loss: none Vision impairments: none  Menarche: 55 yo Menstrual History:  LMP: age 8 yo. Menopausal due to surgical, Hysterectomy with left oophorectomy. Pregnancy history: [id number] Safe at home: good Self breast exams: normal. Mammogram was normal (dense)  Flowsheet Row Office Visit from 06/23/2021 in [facility]  PHQ-2 Total Score 0       Social Hx   Social History   Socioeconomic History   Marital status: Married    Spouse name: Not on file   Number of children: Not on file   Years of education: Not on file   Highest education level: Not on file  Occupational History   Occupation: Charity fundraiser    Comment: [employer]  Tobacco Use   Smoking status: Former    Packs/day: 1.00    Years: 1.00    Pack years: 1.00    Types: Cigarettes   Smokeless tobacco: Never  Vaping Use   Vaping Use: Never used  Substance and Sexual Activity   Alcohol use: Not Currently   Drug use: Never   Sexual activity: Not Currently    Birth control/protection: Post-menopausal   Other Topics Concern   Not on file  Social History Narrative   Not on file   Social Determinants of Health   Financial Resource Strain: Not on file  Food Insecurity: Not on file  Transportation Needs: Not on file  Physical Activity: Not on file  Stress: Not on file  Social Connections: Not on file   Past Medical History:  Diagnosis Date   Depression    GERD (gastroesophageal reflux disease)    Hyperlipidemia    Hypothyroidism    Mobitz type 1 second degree atrioventricular block    unknown if Mobitz 1 or 2.   Past Surgical History:  Procedure Laterality Date   ABDOMINAL HYSTERECTOMY     bladder tacking      Family History  Problem Relation Age of Onset   Breast cancer Mother    Hypertension Mother    Diabetes Father     Review of Systems  Constitutional:  Negative for chills, fatigue and fever.  HENT:  Negative for congestion, ear pain and sore throat.   Respiratory:  Negative for cough and shortness of breath.   Cardiovascular:  Negative for chest pain and palpitations.  Gastrointestinal:  Positive for constipation. Negative for abdominal pain, diarrhea, nausea and vomiting.  Endocrine: Negative for polydipsia, polyphagia and polyuria.  Genitourinary:  Negative for difficulty urinating and dysuria.  Musculoskeletal:  Negative for arthralgias, back pain and myalgias.  Skin:  Negative for rash.  Neurological:  Negative for headaches.  Psychiatric/Behavioral:  Negative for dysphoric mood. The patient is not nervous/anxious.     Objective:  BP 130/60   Pulse 96   Temp 97.8 F (36.6 C)   Resp 15   Ht 5\' 4"  (1.626 m)   Wt 177 lb (80.3 kg)   SpO2 97%   BMI 30.38 kg/m   BP/Weight 08/03/2021 06/23/2021 02/22/2021  Systolic BP 130 124 112  Diastolic BP 60 72 74  Wt. (Lbs) 177 179 177.6  BMI 30.38 30.73 30.48    Physical Exam Vitals reviewed.  Constitutional:      General: She is not in acute distress.    Appearance: Normal appearance. She is normal weight.   HENT:     Right Ear: Tympanic membrane and ear canal normal.     Left Ear: Tympanic membrane and ear canal normal.     Nose: Nose normal. No congestion or rhinorrhea.  Eyes:     Conjunctiva/sclera: Conjunctivae normal.  Neck:     Thyroid: No thyroid mass.     Vascular: No carotid bruit.  Cardiovascular:     Rate and Rhythm: Normal rate and regular rhythm.     Pulses: Normal pulses.     Heart sounds: Normal heart sounds. No murmur heard. Pulmonary:     Effort: Pulmonary effort is normal.     Breath sounds: Normal breath sounds.  Abdominal:     General: Bowel sounds are normal.     Palpations: Abdomen is soft. There is no mass.     Tenderness: There is no abdominal tenderness.  Musculoskeletal:        General: Normal range of motion.  Lymphadenopathy:     Cervical: No cervical adenopathy.  Skin:    General: Skin is warm and dry.  Neurological:     Mental Status: She is alert and oriented to person, place, and time.     Cranial Nerves: No cranial nerve deficit.  Psychiatric:        Mood and Affect: Mood normal.        Behavior: Behavior normal.    Lab Results  Component Value Date   WBC 7.5 06/23/2021   HGB 13.2 06/23/2021   HCT 39.9 06/23/2021   PLT 326 06/23/2021   GLUCOSE 109 (H) 06/23/2021   CHOL 219 (H) 06/23/2021   TRIG 92 06/23/2021   HDL 43 06/23/2021   LDLCALC 160 (H) 06/23/2021   ALT 21 06/23/2021   AST 19 06/23/2021   NA 138 06/23/2021   K 4.6 06/23/2021   CL 104 06/23/2021   CREATININE 0.97 06/23/2021   BUN 16 06/23/2021   CO2 22 06/23/2021   TSH 0.762 06/23/2021   INR 1.0 07/19/2007   HGBA1C 6.0 (H) 06/23/2021      Assessment & Plan:   Problem List Items Addressed This Visit       Digestive   Chronic idiopathic constipation    Start trulance samples        Other   Class 1 obesity due to excess calories with serious comorbidity and body mass index (BMI) of 30.0 to 30.9 in adult   Annual physical exam - Primary    Recommend continue  to work on eating healthy diet and exercise. Recommend colon cancer screening.  No further pap smears needed as patient has hysterectomy.      H/O abdominal hysterectomy    No pap smears needed.        This is a list of the screening recommended for you and  due dates:  Health Maintenance  Topic Date Due   HIV Screening  Never done   Hepatitis C Screening: USPSTF Recommendation to screen - Ages 33-79 yo.  Never done   Tetanus Vaccine  02/14/2009   Colon Cancer Screening  Never done   Zoster (Shingles) Vaccine (1 of 2) Never done   Flu Shot  Never done   Mammogram  07/28/2023   COVID-19 Vaccine  Completed   Pneumococcal Vaccination  Aged Out   HPV Vaccine  Aged Out   Pap Smear  Discontinued     Meds ordered this encounter  Medications   levothyroxine (SYNTHROID) 50 MCG tablet    Sig: TAKE 1 TABLET IN THE MORNING ON AN EMPTY STOMACH EVERY OTHER DAY ALTERNATING WITH 75 MCG    Dispense:  45 tablet    Refill:  3   levothyroxine (SYNTHROID) 75 MCG tablet    Sig: Take one pill every other day alternates every other day with 50 mcg.    Dispense:  45 tablet    Refill:  3     Follow-up: Return in about 8 weeks (around 09/26/2021) for chronic fasting.  An After Visit Summary was printed and given to the patient.  Blane Ohara, MD [facility] [phone]

## 2021-08-21 DIAGNOSIS — K5904 Chronic idiopathic constipation: Secondary | ICD-10-CM | POA: Insufficient documentation

## 2021-08-21 DIAGNOSIS — Z90710 Acquired absence of both cervix and uterus: Secondary | ICD-10-CM | POA: Insufficient documentation

## 2021-08-21 DIAGNOSIS — Z0000 Encounter for general adult medical examination without abnormal findings: Secondary | ICD-10-CM | POA: Insufficient documentation

## 2021-08-21 MED ORDER — TRULANCE 3 MG PO TABS: 3.0000 mg | ORAL_TABLET | Freq: Every day | ORAL | 0 refills | Status: DC

## 2021-08-21 NOTE — Assessment & Plan Note (Signed)
Start trulance samples

## 2021-08-21 NOTE — Assessment & Plan Note (Signed)
No pap smears needed.

## 2021-08-21 NOTE — Assessment & Plan Note (Signed)
Recommend continue to work on eating healthy diet and exercise.  

## 2021-08-21 NOTE — Assessment & Plan Note (Signed)
Recommend continue to work on eating healthy diet and exercise. Recommend colon cancer screening.  No further pap smears needed as patient has hysterectomy.

## 2021-09-30 ENCOUNTER — Encounter: Payer: Self-pay | Admitting: Family Medicine

## 2021-09-30 ENCOUNTER — Other Ambulatory Visit: Payer: Self-pay

## 2021-09-30 ENCOUNTER — Ambulatory Visit: Payer: 59 | Admitting: Family Medicine

## 2021-09-30 ENCOUNTER — Other Ambulatory Visit: Payer: Self-pay | Admitting: Family Medicine

## 2021-09-30 VITALS — BP 108/58 | HR 78 | Temp 97.0°F | Resp 16 | Ht 64.0 in | Wt 174.2 lb

## 2021-09-30 DIAGNOSIS — R7303 Prediabetes: Secondary | ICD-10-CM | POA: Diagnosis not present

## 2021-09-30 DIAGNOSIS — K5904 Chronic idiopathic constipation: Secondary | ICD-10-CM

## 2021-09-30 DIAGNOSIS — E039 Hypothyroidism, unspecified: Secondary | ICD-10-CM

## 2021-09-30 DIAGNOSIS — K219 Gastro-esophageal reflux disease without esophagitis: Secondary | ICD-10-CM | POA: Insufficient documentation

## 2021-09-30 DIAGNOSIS — E782 Mixed hyperlipidemia: Secondary | ICD-10-CM

## 2021-09-30 DIAGNOSIS — Z1211 Encounter for screening for malignant neoplasm of colon: Secondary | ICD-10-CM

## 2021-09-30 NOTE — Progress Notes (Signed)
Subjective:  Patient ID: Judith Clark, female    DOB: May 25, 1966  Age: 55 y.o. MRN: [id number]  Chief Complaint  Patient presents with   Hyperlipidemia   Hypothyroidism    HPI Patient is a 55 year old white female presents for follow-up of hyperlipidemia, hypothyroidism, and prediabetes.  Patient started on Crestor 10 mg once daily and metformin 500 mg once daily at her last appointment 3 months ago.  Patient takes Synthroid 75 mcg and 50 mcg alternating days.  Patient eats healthy.  Patient takes Pepcid for reflux and Trulance for chronic idiopathic constipation.  Patient has no complaints today.  He does try to eat healthy. Patient is under some stress lately denies depression or anxiety.  Her husband has stroke in 2020 and has significant memory loss related to this.  Her husband sees Dr. Shary Decamp   Patient was also most of her colonoscopy however she says she was supposed to be set up with Dr. Kenna Gilbert office is requiring records from [facility] and she has been unable to get dose.  She says the doctor is no longer practicing. Current Outpatient Medications on File Prior to Visit  Medication Sig Dispense Refill   acetaminophen (TYLENOL) 500 MG tablet Take 1,000 mg by mouth every 6 (six) hours as needed for mild pain, moderate pain or headache.     famotidine (PEPCID) 20 MG tablet Take 1 tablet (20 mg total) by mouth 2 (two) times daily. 30 tablet 0   levothyroxine (SYNTHROID) 50 MCG tablet TAKE 1 TABLET IN THE MORNING ON AN EMPTY STOMACH EVERY OTHER DAY ALTERNATING WITH 75 MCG 45 tablet 3   levothyroxine (SYNTHROID) 75 MCG tablet Take one pill every other day alternates every other day with 50 mcg. 45 tablet 3   metFORMIN (GLUCOPHAGE) 500 MG tablet Take 1 tablet (500 mg total) by mouth daily with breakfast. 90 tablet 1   Multiple Vitamins-Minerals (MULTIVITAMIN ADULT PO) Take 1 tablet by mouth daily.     ondansetron (ZOFRAN ODT) 4 MG disintegrating tablet Take 1 tablet (4 mg total) by mouth every  4 (four) hours as needed for nausea or vomiting. 20 tablet 0   Plecanatide (TRULANCE) 3 MG TABS Take 3 mg by mouth daily. 30 tablet 0   rosuvastatin (CRESTOR) 10 MG tablet Take 1 tablet (10 mg total) by mouth daily. 90 tablet 1   No current facility-administered medications on file prior to visit.   Past Medical History:  Diagnosis Date   Depression    GERD (gastroesophageal reflux disease)    Hyperlipidemia    Hypothyroidism    Mobitz type 1 second degree atrioventricular block    unknown if Mobitz 1 or 2.   Past Surgical History:  Procedure Laterality Date   ABDOMINAL HYSTERECTOMY     bladder tacking      Family History  Problem Relation Age of Onset   Breast cancer Mother    Hypertension Mother    Diabetes Father    Social History   Socioeconomic History   Marital status: Married    Spouse name: Not on file   Number of children: Not on file   Years of education: Not on file   Highest education level: Not on file  Occupational History   Occupation: RN    Comment: [employer]  Tobacco Use   Smoking status: Former    Packs/day: 1.00    Years: 1.00    Pack years: 1.00    Types: Cigarettes   Smokeless tobacco: Never  Vaping Use   Vaping Use: Never used  Substance and Sexual Activity   Alcohol use: Not Currently   Drug use: Never   Sexual activity: Not Currently    Birth control/protection: Post-menopausal  Other Topics Concern   Not on file  Social History Narrative   Not on file   Social Determinants of Health   Financial Resource Strain: Not on file  Food Insecurity: Not on file  Transportation Needs: Not on file  Physical Activity: Not on file  Stress: Not on file  Social Connections: Not on file    Review of Systems  Constitutional:  Negative for chills, fatigue and fever.  HENT:  Negative for congestion, rhinorrhea and sore throat.   Respiratory:  Negative for cough and shortness of breath.   Cardiovascular:  Negative for chest pain.   Gastrointestinal:  Negative for abdominal pain, constipation, diarrhea, nausea and vomiting.  Genitourinary:  Negative for dysuria and urgency.  Musculoskeletal:  Negative for back pain and myalgias.  Neurological:  Negative for dizziness, weakness, light-headedness and headaches.  Psychiatric/Behavioral:  Negative for dysphoric mood. The patient is not nervous/anxious.     Objective:  BP (!) 108/58   Pulse 78   Temp (!) 97 F (36.1 C)   Resp 16   Ht 5\' 4"  (1.626 m)   Wt 174 lb 3.2 oz (79 kg)   BMI 29.90 kg/m   BP/Weight 09/30/2021 08/03/2021 06/23/2021  Systolic BP 108 130 124  Diastolic BP 58 60 72  Wt. (Lbs) 174.2 177 179  BMI 29.9 30.38 30.73    Physical Exam Vitals reviewed.  Constitutional:      Appearance: Normal appearance. She is normal weight.  Neck:     Vascular: No carotid bruit.  Cardiovascular:     Rate and Rhythm: Normal rate and regular rhythm.     Heart sounds: Normal heart sounds.  Pulmonary:     Effort: Pulmonary effort is normal. No respiratory distress.     Breath sounds: Normal breath sounds.  Neurological:     Mental Status: She is alert and oriented to person, place, and time.  Psychiatric:        Mood and Affect: Mood normal.        Behavior: Behavior normal.    Diabetic Foot Exam - Simple   No data filed      Lab Results  Component Value Date   WBC 7.5 06/23/2021   HGB 13.2 06/23/2021   HCT 39.9 06/23/2021   PLT 326 06/23/2021   GLUCOSE 109 (H) 06/23/2021   CHOL 219 (H) 06/23/2021   TRIG 92 06/23/2021   HDL 43 06/23/2021   LDLCALC 160 (H) 06/23/2021   ALT 21 06/23/2021   AST 19 06/23/2021   NA 138 06/23/2021   K 4.6 06/23/2021   CL 104 06/23/2021   CREATININE 0.97 06/23/2021   BUN 16 06/23/2021   CO2 22 06/23/2021   TSH 0.762 06/23/2021   INR 1.0 07/19/2007   HGBA1C 6.0 (H) 06/23/2021      Assessment & Plan:   Problem List Items Addressed This Visit       Digestive   Chronic idiopathic constipation - Primary     Continue Trulance 3 mg once daily.      Gastroesophageal reflux disease without esophagitis    Continue Pepcid at current dose.  Patient        Endocrine   Hypothyroidism    The current medical regimen is effective;  continue present plan and  medications.  Continue Synthroid 75 micrograms and 50 mcg alternating days.         Other   Mixed hyperlipidemia    Await labs for recommendations. Continue to work on eating a healthy diet and exercise.  Labs drawn today.        Relevant Orders   CBC with Differential/Platelet   Comprehensive metabolic panel   Lipid panel   Prediabetes    Continue metformin 500 mg once daily. Await labs for further recommendations. Continue to eat healthy and exercise.      Relevant Orders   Hemoglobin A1c   Other Visit Diagnoses     Screen for colon cancer       Relevant Orders   Ambulatory referral to Gastroenterology     .  No orders of the defined types were placed in this encounter.   Orders Placed This Encounter  Procedures   CBC with Differential/Platelet   Comprehensive metabolic panel   Hemoglobin A1c   Lipid panel   Ambulatory referral to Gastroenterology     Follow-up: Return in about 4 months (around 01/29/2022) for chronic fasting.  An After Visit Summary was printed and given to the patient.  Blane Ohara, MD [facility] [phone]

## 2021-09-30 NOTE — Assessment & Plan Note (Addendum)
Continue Pepcid at current dose.  Patient

## 2021-09-30 NOTE — Assessment & Plan Note (Signed)
Continue Trulance 3 mg once daily. 

## 2021-09-30 NOTE — Assessment & Plan Note (Signed)
Await labs for recommendations. Continue to work on eating a healthy diet and exercise.  Labs drawn today.   

## 2021-09-30 NOTE — Assessment & Plan Note (Signed)
The current medical regimen is effective;  continue present plan and medications.  Continue Synthroid 75 micrograms and 50 mcg alternating days.

## 2021-09-30 NOTE — Assessment & Plan Note (Signed)
Continue metformin 500 mg once daily. Await labs for further recommendations. Continue to eat healthy and exercise.

## 2021-10-01 LAB — COMPREHENSIVE METABOLIC PANEL
ALT: 25 IU/L (ref 0–32)
AST: 15 IU/L (ref 0–40)
Albumin/Globulin Ratio: 2 (ref 1.2–2.2)
Albumin: 4.4 g/dL (ref 3.8–4.9)
Alkaline Phosphatase: 102 IU/L (ref 44–121)
BUN/Creatinine Ratio: 16 (ref 9–23)
BUN: 16 mg/dL (ref 6–24)
Bilirubin Total: 0.2 mg/dL (ref 0.0–1.2)
CO2: 21 mmol/L (ref 20–29)
Calcium: 9.4 mg/dL (ref 8.7–10.2)
Chloride: 106 mmol/L (ref 96–106)
Creatinine, Ser: 0.98 mg/dL (ref 0.57–1.00)
Globulin, Total: 2.2 g/dL (ref 1.5–4.5)
Glucose: 105 mg/dL — ABNORMAL HIGH (ref 70–99)
Potassium: 4.9 mmol/L (ref 3.5–5.2)
Sodium: 141 mmol/L (ref 134–144)
Total Protein: 6.6 g/dL (ref 6.0–8.5)
eGFR: 69 mL/min/{1.73_m2} (ref >59)

## 2021-10-01 LAB — CBC WITH DIFFERENTIAL/PLATELET
Basophils Absolute: 0.1 10*3/uL (ref 0.0–0.2)
Basos: 1 %
EOS (ABSOLUTE): 0.4 10*3/uL (ref 0.0–0.4)
Eos: 5 %
Hematocrit: 37.2 % (ref 34.0–46.6)
Hemoglobin: 12.1 g/dL (ref 11.1–15.9)
Immature Grans (Abs): 0 10*3/uL (ref 0.0–0.1)
Immature Granulocytes: 0 %
Lymphocytes Absolute: 3.1 10*3/uL (ref 0.7–3.1)
Lymphs: 34 %
MCH: 27.3 pg (ref 26.6–33.0)
MCHC: 32.5 g/dL (ref 31.5–35.7)
MCV: 84 fL (ref 79–97)
Monocytes Absolute: 0.6 10*3/uL (ref 0.1–0.9)
Monocytes: 7 %
Neutrophils Absolute: 4.9 10*3/uL (ref 1.4–7.0)
Neutrophils: 53 %
Platelets: 329 10*3/uL (ref 150–450)
RBC: 4.43 x10E6/uL (ref 3.77–5.28)
RDW: 13.5 % (ref 11.7–15.4)
WBC: 9 10*3/uL (ref 3.4–10.8)

## 2021-10-01 LAB — LIPID PANEL
Chol/HDL Ratio: 3.5 ratio (ref 0.0–4.4)
Cholesterol, Total: 122 mg/dL (ref 100–199)
HDL: 35 mg/dL — ABNORMAL LOW (ref >39)
LDL Chol Calc (NIH): 70 mg/dL (ref 0–99)
Triglycerides: 86 mg/dL (ref 0–149)
VLDL Cholesterol Cal: 17 mg/dL (ref 5–40)

## 2021-10-01 LAB — HEMOGLOBIN A1C
Est. average glucose Bld gHb Est-mCnc: 128 mg/dL
Hgb A1c MFr Bld: 6.1 % — ABNORMAL HIGH (ref 4.8–5.6)

## 2021-10-01 LAB — CARDIOVASCULAR RISK ASSESSMENT

## 2021-11-30 ENCOUNTER — Telehealth: Payer: Self-pay | Admitting: Family Medicine

## 2021-11-30 NOTE — Telephone Encounter (Signed)
I left a message on the number(s) listed in the patients chart requesting the patient to call back regarding the [place] appointment for 02/02/22. The provider is out of the office that day. The appointment has been canceled. Waiting for the patient to return the call.

## 2021-12-16 ENCOUNTER — Other Ambulatory Visit: Payer: Self-pay

## 2021-12-16 MED ORDER — METFORMIN HCL 500 MG PO TABS: 500.0000 mg | ORAL_TABLET | Freq: Every day | ORAL | 0 refills | Status: DC

## 2022-01-10 ENCOUNTER — Other Ambulatory Visit: Payer: Self-pay

## 2022-01-10 MED ORDER — ROSUVASTATIN CALCIUM 10 MG PO TABS: 10.0000 mg | ORAL_TABLET | Freq: Every day | ORAL | 0 refills | Status: DC

## 2022-01-25 NOTE — Progress Notes (Signed)
Cancelled. Dr. Chasen Mendell  

## 2022-01-26 ENCOUNTER — Other Ambulatory Visit: Payer: Self-pay

## 2022-01-26 ENCOUNTER — Other Ambulatory Visit: Payer: 59

## 2022-01-26 ENCOUNTER — Ambulatory Visit (INDEPENDENT_AMBULATORY_CARE_PROVIDER_SITE_OTHER): Payer: 59 | Admitting: Family Medicine

## 2022-01-26 DIAGNOSIS — K5904 Chronic idiopathic constipation: Secondary | ICD-10-CM

## 2022-01-26 DIAGNOSIS — E782 Mixed hyperlipidemia: Secondary | ICD-10-CM

## 2022-01-26 DIAGNOSIS — R7303 Prediabetes: Secondary | ICD-10-CM

## 2022-01-26 DIAGNOSIS — K219 Gastro-esophageal reflux disease without esophagitis: Secondary | ICD-10-CM

## 2022-01-26 DIAGNOSIS — E039 Hypothyroidism, unspecified: Secondary | ICD-10-CM

## 2022-01-26 MED ORDER — METFORMIN HCL 500 MG PO TABS: 500.0000 mg | ORAL_TABLET | Freq: Every day | ORAL | 0 refills | Status: DC

## 2022-01-27 LAB — CBC WITH DIFFERENTIAL/PLATELET
Basophils Absolute: 0 10*3/uL (ref 0.0–0.2)
Basos: 0 %
EOS (ABSOLUTE): 0.4 10*3/uL (ref 0.0–0.4)
Eos: 3 %
Hematocrit: 39.8 % (ref 34.0–46.6)
Hemoglobin: 12.7 g/dL (ref 11.1–15.9)
Immature Grans (Abs): 0 10*3/uL (ref 0.0–0.1)
Immature Granulocytes: 0 %
Lymphocytes Absolute: 3.1 10*3/uL (ref 0.7–3.1)
Lymphs: 24 %
MCH: 27.1 pg (ref 26.6–33.0)
MCHC: 31.9 g/dL (ref 31.5–35.7)
MCV: 85 fL (ref 79–97)
Monocytes Absolute: 0.9 10*3/uL (ref 0.1–0.9)
Monocytes: 7 %
Neutrophils Absolute: 8.2 10*3/uL — ABNORMAL HIGH (ref 1.4–7.0)
Neutrophils: 66 %
Platelets: 369 10*3/uL (ref 150–450)
RBC: 4.69 x10E6/uL (ref 3.77–5.28)
RDW: 14 % (ref 11.7–15.4)
WBC: 12.7 10*3/uL — ABNORMAL HIGH (ref 3.4–10.8)

## 2022-01-27 LAB — COMPREHENSIVE METABOLIC PANEL
ALT: 29 IU/L (ref 0–32)
AST: 19 IU/L (ref 0–40)
Albumin/Globulin Ratio: 1.7 (ref 1.2–2.2)
Albumin: 4.2 g/dL (ref 3.8–4.9)
Alkaline Phosphatase: 108 IU/L (ref 44–121)
BUN/Creatinine Ratio: 18 (ref 9–23)
BUN: 20 mg/dL (ref 6–24)
Bilirubin Total: 0.2 mg/dL (ref 0.0–1.2)
CO2: 16 mmol/L — ABNORMAL LOW (ref 20–29)
Calcium: 8.8 mg/dL (ref 8.7–10.2)
Chloride: 109 mmol/L — ABNORMAL HIGH (ref 96–106)
Creatinine, Ser: 1.12 mg/dL — ABNORMAL HIGH (ref 0.57–1.00)
Globulin, Total: 2.5 g/dL (ref 1.5–4.5)
Glucose: 109 mg/dL — ABNORMAL HIGH (ref 70–99)
Potassium: 4.7 mmol/L (ref 3.5–5.2)
Sodium: 140 mmol/L (ref 134–144)
Total Protein: 6.7 g/dL (ref 6.0–8.5)
eGFR: 58 mL/min/{1.73_m2} — ABNORMAL LOW (ref >59)

## 2022-01-27 LAB — CARDIOVASCULAR RISK ASSESSMENT

## 2022-01-27 LAB — LIPID PANEL
Chol/HDL Ratio: 3.3 ratio (ref 0.0–4.4)
Cholesterol, Total: 119 mg/dL (ref 100–199)
HDL: 36 mg/dL — ABNORMAL LOW (ref >39)
LDL Chol Calc (NIH): 54 mg/dL (ref 0–99)
Triglycerides: 173 mg/dL — ABNORMAL HIGH (ref 0–149)
VLDL Cholesterol Cal: 29 mg/dL (ref 5–40)

## 2022-01-27 LAB — TSH: TSH: 1.72 u[IU]/mL (ref 0.450–4.500)

## 2022-01-27 LAB — HEMOGLOBIN A1C
Est. average glucose Bld gHb Est-mCnc: 126 mg/dL
Hgb A1c MFr Bld: 6 % — ABNORMAL HIGH (ref 4.8–5.6)

## 2022-01-30 ENCOUNTER — Ambulatory Visit: Payer: 59 | Admitting: Nurse Practitioner

## 2022-01-30 ENCOUNTER — Encounter: Payer: Self-pay | Admitting: Nurse Practitioner

## 2022-01-30 VITALS — BP 108/78 | HR 83 | Temp 97.4°F | Ht 64.0 in | Wt 167.0 lb

## 2022-01-30 DIAGNOSIS — E782 Mixed hyperlipidemia: Secondary | ICD-10-CM

## 2022-01-30 DIAGNOSIS — R7303 Prediabetes: Secondary | ICD-10-CM

## 2022-01-30 DIAGNOSIS — E039 Hypothyroidism, unspecified: Secondary | ICD-10-CM | POA: Diagnosis not present

## 2022-01-30 DIAGNOSIS — Z6828 Body mass index (BMI) 28.0-28.9, adult: Secondary | ICD-10-CM

## 2022-01-30 DIAGNOSIS — R110 Nausea: Secondary | ICD-10-CM

## 2022-01-30 MED ORDER — OZEMPIC (0.25 OR 0.5 MG/DOSE) 2 MG/1.5ML ~~LOC~~ SOPN: 0.2500 mg | PEN_INJECTOR | SUBCUTANEOUS | 0 refills | Status: DC

## 2022-01-30 MED ORDER — METFORMIN HCL 500 MG PO TABS: 500.0000 mg | ORAL_TABLET | Freq: Every day | ORAL | 1 refills | Status: DC

## 2022-01-30 MED ORDER — OZEMPIC (0.25 OR 0.5 MG/DOSE) 2 MG/1.5ML ~~LOC~~ SOPN: 0.5000 mg | PEN_INJECTOR | SUBCUTANEOUS | 0 refills | Status: DC

## 2022-01-30 MED ORDER — ONDANSETRON HCL 4 MG PO TABS: 4.0000 mg | ORAL_TABLET | Freq: Three times a day (TID) | ORAL | 0 refills | Status: AC | PRN

## 2022-01-30 NOTE — Progress Notes (Signed)
? ?Subjective:  ?Patient ID: Judith Clark, female    DOB: Feb 22, 1966  Age: 56 y.o. MRN: [id number] ? ?Chief Complaint  ?Patient presents with  ? Hypothyroidism  ? Hyperlipidemia  ? Prediabetes  ? Gastroesophageal Reflux  ? ? ?HPI ?Judith Clark is a 56 year old Caucasian female that presents for follow-up of hypertension, prediabetes, and hypothyroidism. She exercises regularly and consumes a heart healthy diet. Current weight 167 lbs, BMI 28.67.She has a colonoscopy scheduled 02/09/22.  ? ?Hypertension, follow-up: ?  ?She was last seen for hypertension 4 months ago.  ?BP at that visit was 108/58. Management includes diet and physical activity ? ?She is following a Regular diet. ?She is not exercising. ?She does smoke. ? ?Use of agents associated with hypertension: thyroid hormones.  ? ? ?Symptoms: ?No chest pain No chest pressure  ?No palpitations No syncope  ?No dyspnea No orthopnea  ?No paroxysmal nocturnal dyspnea No lower extremity edema  ? ?Pertinent labs: ?Lab Results  ?Component Value Date  ? CHOL 119 01/26/2022  ? HDL 36 (L) 01/26/2022  ? LDLCALC 54 01/26/2022  ? TRIG 173 (H) 01/26/2022  ? CHOLHDL 3.3 01/26/2022  ? Lab Results  ?Component Value Date  ? NA 140 01/26/2022  ? K 4.7 01/26/2022  ? CREATININE 1.12 (H) 01/26/2022  ? EGFR 58 (L) 01/26/2022  ? GFRNONAA >60 06/08/2016  ? GLUCOSE 109 (H) 01/26/2022  ?  ? ?Lipid/Cholesterol, Follow-up ? ?Last lipid panel Other pertinent labs  ?Lab Results  ?Component Value Date  ? CHOL 119 01/26/2022  ? HDL 36 (L) 01/26/2022  ? LDLCALC 54 01/26/2022  ? TRIG 173 (H) 01/26/2022  ? CHOLHDL 3.3 01/26/2022  ? Lab Results  ?Component Value Date  ? ALT 29 01/26/2022  ? AST 19 01/26/2022  ? PLT 369 01/26/2022  ? TSH 1.720 01/26/2022  ?  ? ?She was last seen for this 4 months ago.  ?Management since that visit includes Crestor. ? ?She reports excellent compliance with treatment. ?She is not having side effects.  ? ?Prediabetes, Follow-up ? ?Lab Results  ?Component Value Date  ? HGBA1C  6.0 (H) 01/26/2022  ? HGBA1C 6.1 (H) 09/30/2021  ? HGBA1C 6.0 (H) 06/23/2021  ? GLUCOSE 109 (H) 01/26/2022  ? GLUCOSE 105 (H) 09/30/2021  ? GLUCOSE 109 (H) 06/23/2021  ? ? ?Last seen for for this 4 months ago.  ?Management since that visit includes Metformin 500 mg QD. Pt has requested alternative medication. States, "Metformin does not work". ?Current symptoms include none and have been stable. ? ?Prior visit with dietician: no ?Current diet: in general, a "healthy" diet   ?Current exercise: cardiovascular workout on exercise equipment and weightlifting ? ?Pertinent Labs: ?   ?Component Value Date/Time  ? CHOL 119 01/26/2022 0812  ? TRIG 173 (H) 01/26/2022 2440  ? CHOLHDL 3.3 01/26/2022 0812  ? CREATININE 1.12 (H) 01/26/2022 1027  ? ? ?Wt Readings from Last 3 Encounters:  ?01/30/22 167 lb (75.8 kg)  ?09/30/21 174 lb 3.2 oz (79 kg)  ?08/03/21 177 lb (80.3 kg)  ?  ?Hypothyroidism: ?Judith Clark has a history of hypothyroidism for several years. Current treatment includes Synthroid 50 mcg and 75 mcg  every-other-day. She tells me that she was notified by her pharmacist that Synthroid 75 mcg has Red Dye #40, which she is sensitive to. She denies any known side effects. She has requested to discontinue Synthroid 75 mcg and continue Synthroid 50 mcg daily. TSH normal at 1.720 on 01/26/22.  ? ?Current Outpatient Medications on File  Prior to Visit  ?Medication Sig Dispense Refill  ? acetaminophen (TYLENOL) 500 MG tablet Take 1,000 mg by mouth every 6 (six) hours as needed for mild pain, moderate pain or headache.    ? famotidine (PEPCID) 20 MG tablet Take 1 tablet (20 mg total) by mouth 2 (two) times daily. 30 tablet 0  ? levothyroxine (SYNTHROID) 50 MCG tablet TAKE 1 TABLET IN THE MORNING ON AN EMPTY STOMACH EVERY OTHER DAY ALTERNATING WITH 75 MCG 45 tablet 3  ? levothyroxine (SYNTHROID) 75 MCG tablet Take one pill every other day alternates every other day with 50 mcg. 45 tablet 3  ? metFORMIN (GLUCOPHAGE) 500 MG tablet Take 1  tablet (500 mg total) by mouth daily with breakfast. 30 tablet 0  ? Multiple Vitamins-Minerals (MULTIVITAMIN ADULT PO) Take 1 tablet by mouth daily.    ? ondansetron (ZOFRAN ODT) 4 MG disintegrating tablet Take 1 tablet (4 mg total) by mouth every 4 (four) hours as needed for nausea or vomiting. 20 tablet 0  ? Plecanatide (TRULANCE) 3 MG TABS Take 3 mg by mouth daily. 30 tablet 0  ? rosuvastatin (CRESTOR) 10 MG tablet Take 1 tablet (10 mg total) by mouth daily. 90 tablet 0  ? ?No current facility-administered medications on file prior to visit.  ? ?Past Medical History:  ?Diagnosis Date  ? Depression   ? GERD (gastroesophageal reflux disease)   ? Hyperlipidemia   ? Hypothyroidism   ? Mobitz type 1 second degree atrioventricular block   ? unknown if Mobitz 1 or 2.  ? ?Past Surgical History:  ?Procedure Laterality Date  ? ABDOMINAL HYSTERECTOMY    ? bladder tacking    ?  ?Family History  ?Problem Relation Age of Onset  ? Breast cancer Mother   ? Hypertension Mother   ? Diabetes Father   ? ?Social History  ? ?Socioeconomic History  ? Marital status: Married  ?  Spouse name: Not on file  ? Number of children: Not on file  ? Years of education: Not on file  ? Highest education level: Not on file  ?Occupational History  ? Occupation: Therapist, sports  ?  Comment: [employer]  ?Tobacco Use  ? Smoking status: Former  ?  Packs/day: 1.00  ?  Years: 1.00  ?  Pack years: 1.00  ?  Types: Cigarettes  ? Smokeless tobacco: Never  ?Vaping Use  ? Vaping Use: Never used  ?Substance and Sexual Activity  ? Alcohol use: Not Currently  ? Drug use: Never  ? Sexual activity: Not Currently  ?  Birth control/protection: Post-menopausal  ?Other Topics Concern  ? Not on file  ?Social History Narrative  ? Not on file  ? ?Social Determinants of Health  ? ?Financial Resource Strain: Not on file  ?Food Insecurity: Not on file  ?Transportation Needs: Not on file  ?Physical Activity: Not on file  ?Stress: Not on file  ?Social Connections: Not on file   ? ? ?Review of Systems  ?Constitutional:  Negative for chills, fatigue and fever.  ?HENT:  Negative for congestion, ear pain, rhinorrhea and sore throat.   ?Respiratory:  Negative for cough and shortness of breath.   ?Cardiovascular:  Negative for chest pain.  ?Gastrointestinal:  Negative for abdominal pain, constipation, diarrhea, nausea and vomiting.  ?Genitourinary:  Negative for dysuria and urgency.  ?Musculoskeletal:  Negative for back pain and myalgias.  ?Neurological:  Negative for dizziness, weakness, light-headedness and headaches.  ?Psychiatric/Behavioral:  Negative for dysphoric mood. The patient is  not nervous/anxious.   ? ? ?Objective:  ?BP 108/78   Pulse 83   Temp (!) 97.4 ?F (36.3 ?C)   Ht $R'5\' 4"'YB$  (1.626 m)   Wt 167 lb (75.8 kg)   SpO2 97%   BMI 28.67 kg/m?   ? ? ?  09/30/2021  ?  7:48 AM 08/03/2021  ?  8:28 AM 06/23/2021  ?  8:13 AM  ?BP/Weight  ?Systolic BP 282 060 156  ?Diastolic BP 58 60 72  ?Wt. (Lbs) 174.2 177 179  ?BMI 29.9 kg/m2 30.38 kg/m2 30.73 kg/m2  ? ? ?Physical Exam ?Vitals reviewed.  ?Constitutional:   ?   Appearance: Normal appearance.  ?HENT:  ?   Head: Normocephalic.  ?   Right Ear: Tympanic membrane normal.  ?   Left Ear: Tympanic membrane normal.  ?   Nose: Nose normal.  ?   Mouth/Throat:  ?   Mouth: Mucous membranes are moist.  ?Eyes:  ?   Pupils: Pupils are equal, round, and reactive to light.  ?Cardiovascular:  ?   Rate and Rhythm: Normal rate and regular rhythm.  ?   Pulses: Normal pulses.  ?   Heart sounds: Normal heart sounds.  ?Pulmonary:  ?   Effort: Pulmonary effort is normal.  ?   Breath sounds: Normal breath sounds.  ?Abdominal:  ?   General: Bowel sounds are normal.  ?   Palpations: Abdomen is soft.  ?Musculoskeletal:     ?   General: Normal range of motion.  ?Skin: ?   General: Skin is warm and dry.  ?   Capillary Refill: Capillary refill takes less than 2 seconds.  ?Neurological:  ?   General: No focal deficit present.  ?   Mental Status: She is alert and oriented  to person, place, and time.  ?Psychiatric:     ?   Mood and Affect: Mood normal.     ?   Behavior: Behavior normal.  ? ? ? ?  ? ?Lab Results  ?Component Value Date  ? WBC 12.7 (H) 01/26/2022  ? HGB 12.7 04/0

## 2022-01-30 NOTE — Patient Instructions (Addendum)
Stop Levothyroxine 75 mcg due to red dye ?Take Levothyroxine 50 mcg daily ?Begin Ozempic 0.25 mg injection weekly for 4 weeks, then increase to 0.5 mg injection weekly for 4 weeks ?Follow-up in 64-months, fasting ? ?Semaglutide Injection ?What is this medication? ?SEMAGLUTIDE (SEM a GLOO tide) treats type 2 diabetes. It works by increasing insulin levels in your body, which decreases your blood sugar (glucose). It also reduces the amount of sugar released into the blood and slows down your digestion. It can also be used to lower the risk of heart attack and stroke in people with type 2 diabetes. Changes to diet and exercise are often combined with this medication. ?This medicine may be used for other purposes; ask your health care provider or pharmacist if you have questions. ?COMMON BRAND NAME(S): OZEMPIC ?What should I tell my care team before I take this medication? ?They need to know if you have any of these conditions: ?Endocrine tumors (MEN 2) or if someone in your family had these tumors ?Eye disease, vision problems ?History of pancreatitis ?Kidney disease ?Stomach problems ?Thyroid cancer or if someone in your family had thyroid cancer ?An unusual or allergic reaction to semaglutide, other medications, foods, dyes, or preservatives ?Pregnant or trying to get pregnant ?Breast-feeding ?How should I use this medication? ?This medication is for injection under the skin of your upper leg (thigh), stomach area, or upper arm. It is given once every week (every 7 days). You will be taught how to prepare and give this medication. Use exactly as directed. Take your medication at regular intervals. Do not take it more often than directed. ?If you use this medication with insulin, you should inject this medication and the insulin separately. Do not mix them together. Do not give the injections right next to each other. Change (rotate) injection sites with each injection. ?It is important that you put your used needles  and syringes in a special sharps container. Do not put them in a trash can. If you do not have a sharps container, call your pharmacist or care team to get one. ?A special MedGuide will be given to you by the pharmacist with each prescription and refill. Be sure to read this information carefully each time. ?This medication comes with INSTRUCTIONS FOR USE. Ask your pharmacist for directions on how to use this medication. Read the information carefully. Talk to your pharmacist or care team if you have questions. ?Talk to your care team about the use of this medication in children. Special care may be needed. ?Overdosage: If you think you have taken too much of this medicine contact a poison control center or emergency room at once. ?NOTE: This medicine is only for you. Do not share this medicine with others. ?What if I miss a dose? ?If you miss a dose, take it as soon as you can within 5 days after the missed dose. Then take your next dose at your regular weekly time. If it has been longer than 5 days after the missed dose, do not take the missed dose. Take the next dose at your regular time. Do not take double or extra doses. If you have questions about a missed dose, contact your care team for advice. ?What may interact with this medication? ?Other medications for diabetes ?Many medications may cause changes in blood sugar, these include: ?Alcohol containing beverages ?Antiviral medications for HIV or AIDS ?Aspirin and aspirin-like medications ?Certain medications for blood pressure, heart disease, irregular heart beat ?Chromium ?Diuretics ?Female hormones, such as  estrogens or progestins, birth control pills ?Fenofibrate ?Gemfibrozil ?Isoniazid ?Lanreotide ?Female hormones or anabolic steroids ?MAOIs like Carbex, Eldepryl, Marplan, Nardil, and Parnate ?Medications for weight loss ?Medications for allergies, asthma, cold, or cough ?Medications for depression, anxiety, or psychotic  disturbances ?Niacin ?Nicotine ?NSAIDs, medications for pain and inflammation, like ibuprofen or naproxen ?Octreotide ?Pasireotide ?Pentamidine ?Phenytoin ?Probenecid ?Quinolone antibiotics such as ciprofloxacin, levofloxacin, ofloxacin ?Some herbal dietary supplements ?Steroid medications such as prednisone or cortisone ?Sulfamethoxazole; trimethoprim ?Thyroid hormones ?Some medications can hide the warning symptoms of low blood sugar (hypoglycemia). You may need to monitor your blood sugar more closely if you are taking one of these medications. These include: ?Beta-blockers, often used for high blood pressure or heart problems (examples include atenolol, metoprolol, propranolol) ?Clonidine ?Guanethidine ?Reserpine ?This list may not describe all possible interactions. Give your health care provider a list of all the medicines, herbs, non-prescription drugs, or dietary supplements you use. Also tell them if you smoke, drink alcohol, or use illegal drugs. Some items may interact with your medicine. ?What should I watch for while using this medication? ?Visit your care team for regular checks on your progress. ?Drink plenty of fluids while taking this medication. Check with your care team if you get an attack of severe diarrhea, nausea, and vomiting. The loss of too much body fluid can make it dangerous for you to take this medication. ?A test called the HbA1C (A1C) will be monitored. This is a simple blood test. It measures your blood sugar control over the last 2 to 3 months. You will receive this test every 3 to 6 months. ?Learn how to check your blood sugar. Learn the symptoms of low and high blood sugar and how to manage them. ?Always carry a quick-source of sugar with you in case you have symptoms of low blood sugar. Examples include hard sugar candy or glucose tablets. Make sure others know that you can choke if you eat or drink when you develop serious symptoms of low blood sugar, such as seizures or  unconsciousness. They must get medical help at once. ?Tell your care team if you have high blood sugar. You might need to change the dose of your medication. If you are sick or exercising more than usual, you might need to change the dose of your medication. ?Do not skip meals. Ask your care team if you should avoid alcohol. Many nonprescription cough and cold products contain sugar or alcohol. These can affect blood sugar. ?Pens should never be shared. Even if the needle is changed, sharing may result in passing of viruses like hepatitis or HIV. ?Wear a medical ID bracelet or chain, and carry a card that describes your disease and details of your medication and dosage times. ?Do not become pregnant while taking this medication. Women should inform their care team if they wish to become pregnant or think they might be pregnant. There is a potential for serious side effects to an unborn child. Talk to your care team for more information. ?What side effects may I notice from receiving this medication? ?Side effects that you should report to your care team as soon as possible: ?Allergic reactions--skin rash, itching, hives, swelling of the face, lips, tongue, or throat ?Change in vision ?Dehydration--increased thirst, dry mouth, feeling faint or lightheaded, headache, dark yellow or brown urine ?Gallbladder problems--severe stomach pain, nausea, vomiting, fever ?Heart palpitations--rapid, pounding, or irregular heartbeat ?Kidney injury--decrease in the amount of urine, swelling of the ankles, hands, or feet ?Pancreatitis--severe stomach pain that spreads  to your back or gets worse after eating or when touched, fever, nausea, vomiting ?Thyroid cancer--new mass or lump in the neck, pain or trouble swallowing, trouble breathing, hoarseness ?Side effects that usually do not require medical attention (report to your care team if they continue or are bothersome): ?Diarrhea ?Loss of appetite ?Nausea ?Stomach  pain ?Vomiting ?This list may not describe all possible side effects. Call your doctor for medical advice about side effects. You may report side effects to FDA at 1-800-FDA-1088. ?Where should I keep my medication? ?Keep out of the reach of children. ?Store un

## 2022-01-31 ENCOUNTER — Telehealth: Payer: Self-pay

## 2022-01-31 NOTE — Telephone Encounter (Signed)
PA submitted and denied via covermymeds for Ozempic. Denied due to patient not being diabetic. ?

## 2022-02-02 ENCOUNTER — Ambulatory Visit: Payer: 59 | Admitting: Family Medicine

## 2022-02-06 ENCOUNTER — Telehealth: Payer: Self-pay

## 2022-02-06 NOTE — Telephone Encounter (Signed)
Attempted to call patient, family member answered. They will have patient return call tomorrow.  ? ?Judith Clark 02/06/22 5:10 PM ? ?

## 2022-02-06 NOTE — Telephone Encounter (Signed)
Patient calling requesting update of ozempic. She was informed the PA was denied. She feels like the ozempic gave her cold like symptoms. She began ozempic 4/10 and on 4/12 symptoms began. Please advise next step. Patient is aware Carollee Herter is not in office until end of week. Will send note to PCP and [facility].  ? ?Callback: [phone] ? ?Lorita Officer, CCMA 02/06/22 11:10 AM ? ?

## 2022-02-08 NOTE — Telephone Encounter (Signed)
Spoke with patient, verbalized understanding and had no questions at this time. ?Patient states she eats healthy and her glucose was 86 on Sunday. On Monday it was 107. Patient states she has not took any of her metformin. I told patient to start back on metformin. She states if she doesn't see any changes she would like to try something else.  ?

## 2022-02-08 NOTE — Telephone Encounter (Signed)
Left message for patient to return call.

## 2022-02-09 LAB — HM COLONOSCOPY

## 2022-02-23 ENCOUNTER — Other Ambulatory Visit: Payer: Self-pay | Admitting: Family Medicine

## 2022-02-23 DIAGNOSIS — R7303 Prediabetes: Secondary | ICD-10-CM

## 2022-02-23 NOTE — Telephone Encounter (Signed)
Refill sent to pharmacy.   

## 2022-04-06 ENCOUNTER — Other Ambulatory Visit: Payer: Self-pay

## 2022-04-06 ENCOUNTER — Other Ambulatory Visit: Payer: Self-pay | Admitting: Family Medicine

## 2022-04-06 MED ORDER — LEVOTHYROXINE SODIUM 50 MCG PO TABS: ORAL_TABLET | ORAL | 0 refills | Status: DC

## 2022-04-06 NOTE — Telephone Encounter (Signed)
Patient has been taking levothyroxine 50 mcg daily. She was previously alternating this every other day with 75 mcg. She is coming into office on July 11th for labs and July 13th for office visit. She is requesting levothyroxine 50 mcg to [facility] for 30 day supply and a 90 day supply be sent to [facility]. Please advise.   Lorita Officer, CCMA 04/06/22 8:08 AM

## 2022-05-02 ENCOUNTER — Other Ambulatory Visit: Payer: 59

## 2022-05-04 ENCOUNTER — Ambulatory Visit: Payer: 59 | Admitting: Family Medicine

## 2022-05-07 ENCOUNTER — Other Ambulatory Visit: Payer: Self-pay | Admitting: Family Medicine

## 2022-05-17 NOTE — Progress Notes (Signed)
Subjective:  Patient ID: Judith Clark, female    DOB: 10/01/1966  Age: 56 y.o. MRN: [id number]  Chief Complaint  Patient presents with  . Hypertension  . Hyperlipidemia  . Hypothyroidism  . Prediabetes   HPI: Pt presents for follow-up of Hyperlipidemia, prediabetes, and hypothyroidism. She has been under increased stress recently due to spouse's severe CVA and other health problems. She has been traveling out of town to work two days a week. She is exercising regularly and eating a heart healthy diet.    Lipid/Cholesterol, Follow-up  Last lipid panel Other pertinent labs  Lab Results  Component Value Date   CHOL 119 01/26/2022   HDL 36 (L) 01/26/2022   LDLCALC 54 01/26/2022   TRIG 173 (H) 01/26/2022   CHOLHDL 3.3 01/26/2022   Lab Results  Component Value Date   ALT 29 01/26/2022   AST 19 01/26/2022   PLT 369 01/26/2022   TSH 1.720 01/26/2022     She was last seen for this 3 months ago.  Management includes diet modification and exercise. Pt discontinued Crestor  Prediabetes, Follow-up  Lab Results  Component Value Date   HGBA1C 6.0 (H) 01/26/2022   HGBA1C 6.1 (H) 09/30/2021   HGBA1C 6.0 (H) 06/23/2021   GLUCOSE 109 (H) 01/26/2022   GLUCOSE 105 (H) 09/30/2021   GLUCOSE 109 (H) 06/23/2021    Last seen for for this3 months ago.  Management since that visit includes diet modification and exercise. Pt discontinued Metformin and Ozempic Current symptoms include none and have been stable.  Prior visit with dietician: no Current diet: in general, a "healthy" diet   Current exercise: weightlifting and walking  Pertinent Labs:    Component Value Date/Time   CHOL 119 01/26/2022 0812   TRIG 173 (H) 01/26/2022 0812   CHOLHDL 3.3 01/26/2022 0812   CREATININE 1.12 (H) 01/26/2022 0812    Wt Readings from Last 3 Encounters:  01/30/22 167 lb (75.8 kg)  09/30/21 174 lb 3.2 oz (79 kg)  08/03/21 177 lb (80.3 kg)     Hypothyroidism Follow-up: Pt has a past history  of hypothyroidism. States she does not tolerate generic Levothyroxine well. Last TSH 1.720 on 01/26/22. Current symptoms include dry skin and constipation, she tells me she was prescribed generic Levothyroxine by pharmacy.    Current Outpatient Medications on File Prior to Visit  Medication Sig Dispense Refill  . acetaminophen (TYLENOL) 500 MG tablet Take 1,000 mg by mouth every 6 (six) hours as needed for mild pain, moderate pain or headache.    . famotidine (PEPCID) 20 MG tablet Take 1 tablet (20 mg total) by mouth 2 (two) times daily. 30 tablet 0  . levothyroxine (SYNTHROID) 50 MCG tablet TAKE 1 TABLET BY MOUTH IN THE MORNING ON AN EMPTY STOMACH 90 tablet 2  . metFORMIN (GLUCOPHAGE) 500 MG tablet TAKE 1 TABLET(500 MG) BY MOUTH DAILY WITH BREAKFAST 30 tablet 0  . Multiple Vitamins-Minerals (MULTIVITAMIN ADULT PO) Take 1 tablet by mouth daily.    . ondansetron (ZOFRAN ODT) 4 MG disintegrating tablet Take 1 tablet (4 mg total) by mouth every 4 (four) hours as needed for nausea or vomiting. 20 tablet 0  . ondansetron (ZOFRAN) 4 MG tablet Take 1 tablet (4 mg total) by mouth every 8 (eight) hours as needed for nausea or vomiting. 20 tablet 0  . Plecanatide (TRULANCE) 3 MG TABS Take 3 mg by mouth daily. 30 tablet 0  . rosuvastatin (CRESTOR) 10 MG tablet Take 1 tablet (10  mg total) by mouth daily. 90 tablet 0  . Semaglutide,0.25 or 0.5MG /DOS, (OZEMPIC, 0.25 OR 0.5 MG/DOSE,) 2 MG/1.5ML SOPN Inject 0.5 mg into the skin once a week. 3 mL 0  . Semaglutide,0.25 or 0.5MG /DOS, (OZEMPIC, 0.25 OR 0.5 MG/DOSE,) 2 MG/1.5ML SOPN Inject 0.25 mg into the skin once a week. 3 mL 0   No current facility-administered medications on file prior to visit.   Past Medical History:  Diagnosis Date  . Depression   . GERD (gastroesophageal reflux disease)   . Hyperlipidemia   . Hypothyroidism   . Mobitz type 1 second degree atrioventricular block    unknown if Mobitz 1 or 2.   Past Surgical History:  Procedure  Laterality Date  . ABDOMINAL HYSTERECTOMY    . bladder tacking      Family History  Problem Relation Age of Onset  . Breast cancer Mother   . Hypertension Mother   . Diabetes Father    Social History   Socioeconomic History  . Marital status: Married    Spouse name: Not on file  . Number of children: Not on file  . Years of education: Not on file  . Highest education level: Not on file  Occupational History  . Occupation: Charity fundraiser    Comment: [employer]  Tobacco Use  . Smoking status: Former    Packs/day: 1.00    Years: 1.00    Total pack years: 1.00    Types: Cigarettes  . Smokeless tobacco: Never  Vaping Use  . Vaping Use: Never used  Substance and Sexual Activity  . Alcohol use: Not Currently  . Drug use: Never  . Sexual activity: Not Currently    Birth control/protection: Post-menopausal  Other Topics Concern  . Not on file  Social History Narrative  . Not on file   Social Determinants of Health   Financial Resource Strain: Not on file  Food Insecurity: Not on file  Transportation Needs: Not on file  Physical Activity: Not on file  Stress: Not on file  Social Connections: Not on file    Review of Systems  Constitutional:  Negative for appetite change, fatigue and fever.  HENT:  Negative for congestion, ear pain, sinus pressure and sore throat.   Respiratory:  Negative for cough, chest tightness, shortness of breath and wheezing.   Cardiovascular:  Negative for chest pain and palpitations.  Gastrointestinal:  Negative for abdominal pain, constipation, diarrhea, nausea and vomiting.  Genitourinary:  Negative for dysuria and hematuria.  Musculoskeletal:  Negative for arthralgias, back pain, joint swelling and myalgias.  Skin:  Negative for rash.  Neurological:  Negative for dizziness, weakness and headaches.  Psychiatric/Behavioral:  Negative for dysphoric mood. The patient is not nervous/anxious.      Objective:  BP 110/78 (BP Location: Left  Arm, Patient Position: Sitting)   Pulse 83   Temp 97.6 F (36.4 C) (Oral)   Ht 5\' 4"  (1.626 m)   Wt 162 lb (73.5 kg)   SpO2 97%   BMI 27.81 kg/m       01/30/2022    8:22 AM 09/30/2021    7:48 AM 08/03/2021    8:28 AM  BP/Weight  Systolic BP 108 108 130  Diastolic BP 78 58 60  Wt. (Lbs) 167 174.2 177  BMI 28.67 kg/m2 29.9 kg/m2 30.38 kg/m2    Physical Exam Vitals reviewed.  Constitutional:      Appearance: Normal appearance. She is normal weight.  Cardiovascular:     Rate and  Rhythm: Normal rate and regular rhythm.     Pulses: Normal pulses.     Heart sounds: Normal heart sounds.  Pulmonary:     Effort: Pulmonary effort is normal.     Breath sounds: Normal breath sounds.  Abdominal:     General: Abdomen is flat. Bowel sounds are normal.     Palpations: Abdomen is soft.  Neurological:     Mental Status: She is alert and oriented to person, place, and time.  Psychiatric:        Mood and Affect: Mood normal.        Behavior: Behavior normal.      Lab Results  Component Value Date   WBC 12.7 (H) 01/26/2022   HGB 12.7 01/26/2022   HCT 39.8 01/26/2022   PLT 369 01/26/2022   GLUCOSE 109 (H) 01/26/2022   CHOL 119 01/26/2022   TRIG 173 (H) 01/26/2022   HDL 36 (L) 01/26/2022   LDLCALC 54 01/26/2022   ALT 29 01/26/2022   AST 19 01/26/2022   NA 140 01/26/2022   K 4.7 01/26/2022   CL 109 (H) 01/26/2022   CREATININE 1.12 (H) 01/26/2022   BUN 20 01/26/2022   CO2 16 (L) 01/26/2022   TSH 1.720 01/26/2022   INR 1.0 07/19/2007   HGBA1C 6.0 (H) 01/26/2022      Assessment & Plan:   1. Mixed hyperlipidemia-not at goal - CBC With Diff/Platelet - Comprehensive metabolic panel - Lipid panel  2. Acquired hypothyroidism-not at goal - T4, free - TSH -continue Synthroid 50 mcg  3. Prediabetes- - Hemoglobin A1c  4. Seasonal allergic rhinitis due to pollen - fluticasone (FLONASE) 50 MCG/ACT nasal spray; Place 2 sprays into both nostrils daily.  Dispense: 16 g;  Refill: 6    We will call you with lab results Take Synthroid 50 mcg daily, pending labs Use Flonase spray daily for allergic rhinitis Follow-up in 15-months    Follow-up: 28-months  An After Visit Summary was printed and given to the patient.  I, Rip Harbour, NP, have reviewed all documentation for this visit. The documentation on 05/18/22 for the exam, diagnosis, procedures, and orders are all accurate and complete.   I,Lauren M Auman,acting as a Education administrator for [employer], NP.,have documented all relevant documentation on the behalf of Rip Harbour, NP,as directed by  Rip Harbour, NP while in the presence of Rip Harbour, NP.   Signed,  Rip Harbour, NP [place] [phone]

## 2022-05-18 ENCOUNTER — Ambulatory Visit: Payer: 59 | Admitting: Nurse Practitioner

## 2022-05-18 ENCOUNTER — Encounter: Payer: Self-pay | Admitting: Nurse Practitioner

## 2022-05-18 VITALS — BP 110/78 | HR 83 | Temp 97.6°F | Ht 64.0 in | Wt 162.0 lb

## 2022-05-18 DIAGNOSIS — E039 Hypothyroidism, unspecified: Secondary | ICD-10-CM | POA: Diagnosis not present

## 2022-05-18 DIAGNOSIS — J301 Allergic rhinitis due to pollen: Secondary | ICD-10-CM | POA: Diagnosis not present

## 2022-05-18 DIAGNOSIS — E782 Mixed hyperlipidemia: Secondary | ICD-10-CM | POA: Diagnosis not present

## 2022-05-18 DIAGNOSIS — R7303 Prediabetes: Secondary | ICD-10-CM

## 2022-05-18 MED ORDER — FLUTICASONE PROPIONATE 50 MCG/ACT NA SUSP: 2.0000 | Freq: Every day | NASAL | 6 refills | Status: DC

## 2022-05-18 NOTE — Patient Instructions (Addendum)
We will call you with lab results Take Synthroid 50 mcg daily, pending labs Use Flonase spray daily for allergic rhinitis Follow-up in 67-months    Hypothyroidism  Hypothyroidism is when the thyroid gland does not make enough of certain hormones. This is called an underactive thyroid. The thyroid gland is a small gland located in the lower front part of the neck, just in front of the windpipe (trachea). This gland makes hormones that help control how the body uses food for energy (metabolism) as well as how the heart and brain function. These hormones also play a role in keeping your bones strong. When the thyroid is underactive, it produces too little of the hormones thyroxine (T4) and triiodothyronine (T3). What are the causes? This condition may be caused by: Hashimoto's disease. This is a disease in which the body's disease-fighting system (immune system) attacks the thyroid gland. This is the most common cause. Viral infections. Pregnancy. Certain medicines. Birth defects. Problems with a gland in the center of the brain (pituitary gland). Lack of enough iodine in the diet. Other causes may include: Past radiation treatments to the head or neck for cancer. Past treatment with radioactive iodine. Past exposure to radiation in the environment. Past surgical removal of part or all of the thyroid. What increases the risk? You are more likely to develop this condition if: You are female. You have a family history of thyroid conditions. You use a medicine called lithium. You take medicines that affect the immune system (immunosuppressants). What are the signs or symptoms? Common symptoms of this condition include: Not being able to tolerate cold. Feeling as though you have no energy (lethargy). Lack of appetite. Constipation. Sadness or depression. Weight gain that is not explained by a change in diet or exercise habits. Menstrual irregularity. Dry skin, coarse hair, or brittle  nails. Other symptoms may include: Muscle pain. Slowing of thought processes. Poor memory. How is this diagnosed? This condition may be diagnosed based on: Your symptoms, your medical history, and a physical exam. Blood tests. You may also have imaging tests, such as an ultrasound or MRI. How is this treated? This condition is treated with medicine that replaces the thyroid hormones that your body does not make. After you begin treatment, it may take several weeks for symptoms to go away. Follow these instructions at home: Take over-the-counter and prescription medicines only as told by your health care provider. If you start taking any new medicines, tell your health care provider. Keep all follow-up visits as told by your health care provider. This is important. As your condition improves, your dosage of thyroid hormone medicine may change. You will need to have blood tests regularly so that your health care provider can monitor your condition. Contact a health care provider if: Your symptoms do not get better with treatment. You are taking thyroid hormone replacement medicine and you: Sweat a lot. Have tremors. Feel anxious. Lose weight rapidly. Cannot tolerate heat. Have emotional swings. Have diarrhea. Feel weak. Get help right away if: You have chest pain. You have an irregular heartbeat. You have a rapid heartbeat. You have difficulty breathing. These symptoms may be an emergency. Get help right away. Call 911. Do not wait to see if the symptoms will go away. Do not drive yourself to the hospital. Summary Hypothyroidism is when the thyroid gland does not make enough of certain hormones (it is underactive). When the thyroid is underactive, it produces too little of the hormones thyroxine (T4) and triiodothyronine (T3).  The most common cause is Hashimoto's disease, a disease in which the body's disease-fighting system (immune system) attacks the thyroid gland. The  condition can also be caused by viral infections, medicine, pregnancy, or past radiation treatment to the head or neck. Symptoms may include weight gain, dry skin, constipation, feeling as though you do not have energy, and not being able to tolerate cold. This condition is treated with medicine to replace the thyroid hormones that your body does not make. This information is not intended to replace advice given to you by your health care provider. Make sure you discuss any questions you have with your health care provider. Document Revised: 10/11/2021 Document Reviewed: 10/11/2021 Elsevier Patient Education  2023 [employer].

## 2022-05-19 LAB — CBC WITH DIFF/PLATELET
Basophils Absolute: 0.1 10*3/uL (ref 0.0–0.2)
Basos: 1 %
EOS (ABSOLUTE): 0.2 10*3/uL (ref 0.0–0.4)
Eos: 2 %
Hematocrit: 39.9 % (ref 34.0–46.6)
Hemoglobin: 13.4 g/dL (ref 11.1–15.9)
Immature Grans (Abs): 0 10*3/uL (ref 0.0–0.1)
Immature Granulocytes: 0 %
Lymphocytes Absolute: 2.9 10*3/uL (ref 0.7–3.1)
Lymphs: 37 %
MCH: 27.5 pg (ref 26.6–33.0)
MCHC: 33.6 g/dL (ref 31.5–35.7)
MCV: 82 fL (ref 79–97)
Monocytes Absolute: 0.6 10*3/uL (ref 0.1–0.9)
Monocytes: 7 %
Neutrophils Absolute: 4.1 10*3/uL (ref 1.4–7.0)
Neutrophils: 53 %
Platelets: 358 10*3/uL (ref 150–450)
RBC: 4.88 x10E6/uL (ref 3.77–5.28)
RDW: 13.8 % (ref 11.7–15.4)
WBC: 7.8 10*3/uL (ref 3.4–10.8)

## 2022-05-19 LAB — COMPREHENSIVE METABOLIC PANEL
ALT: 28 IU/L (ref 0–32)
AST: 18 IU/L (ref 0–40)
Albumin/Globulin Ratio: 1.6 (ref 1.2–2.2)
Albumin: 4.4 g/dL (ref 3.8–4.9)
Alkaline Phosphatase: 97 IU/L (ref 44–121)
BUN/Creatinine Ratio: 21 (ref 9–23)
BUN: 21 mg/dL (ref 6–24)
Bilirubin Total: 0.5 mg/dL (ref 0.0–1.2)
CO2: 18 mmol/L — ABNORMAL LOW (ref 20–29)
Calcium: 9.6 mg/dL (ref 8.7–10.2)
Chloride: 104 mmol/L (ref 96–106)
Creatinine, Ser: 1 mg/dL (ref 0.57–1.00)
Globulin, Total: 2.8 g/dL (ref 1.5–4.5)
Glucose: 104 mg/dL — ABNORMAL HIGH (ref 70–99)
Potassium: 4.4 mmol/L (ref 3.5–5.2)
Sodium: 137 mmol/L (ref 134–144)
Total Protein: 7.2 g/dL (ref 6.0–8.5)
eGFR: 67 mL/min/{1.73_m2} (ref >59)

## 2022-05-19 LAB — LIPID PANEL
Chol/HDL Ratio: 5 ratio — ABNORMAL HIGH (ref 0.0–4.4)
Cholesterol, Total: 217 mg/dL — ABNORMAL HIGH (ref 100–199)
HDL: 43 mg/dL (ref >39)
LDL Chol Calc (NIH): 150 mg/dL — ABNORMAL HIGH (ref 0–99)
Triglycerides: 133 mg/dL (ref 0–149)
VLDL Cholesterol Cal: 24 mg/dL (ref 5–40)

## 2022-05-19 LAB — CARDIOVASCULAR RISK ASSESSMENT

## 2022-05-19 LAB — HEMOGLOBIN A1C
Est. average glucose Bld gHb Est-mCnc: 123 mg/dL
Hgb A1c MFr Bld: 5.9 % — ABNORMAL HIGH (ref 4.8–5.6)

## 2022-05-19 LAB — T4, FREE: Free T4: 1.32 ng/dL (ref 0.82–1.77)

## 2022-05-19 LAB — TSH: TSH: 1.75 u[IU]/mL (ref 0.450–4.500)

## 2022-06-20 ENCOUNTER — Other Ambulatory Visit: Payer: Self-pay

## 2022-06-20 ENCOUNTER — Telehealth: Payer: Self-pay

## 2022-06-20 ENCOUNTER — Other Ambulatory Visit: Payer: Self-pay | Admitting: Family Medicine

## 2022-06-20 MED ORDER — SCOPOLAMINE 1 MG/3DAYS TD PT72
1.0000 | MEDICATED_PATCH | TRANSDERMAL | 0 refills | Status: DC
Start: 2022-06-20 — End: 2022-08-25

## 2022-06-20 MED ORDER — SYNTHROID 50 MCG PO TABS: 50.0000 ug | ORAL_TABLET | Freq: Every day | ORAL | 1 refills | Status: DC

## 2022-06-20 NOTE — Telephone Encounter (Signed)
Patient is going on cruise, she is requesting motion sickness patches.   Judith Clark 06/20/22 4:43 PM

## 2022-06-20 NOTE — Telephone Encounter (Signed)
[facility] [place] [employer] [address].

## 2022-06-21 ENCOUNTER — Other Ambulatory Visit: Payer: Self-pay | Admitting: Family Medicine

## 2022-07-14 ENCOUNTER — Other Ambulatory Visit: Payer: Self-pay | Admitting: Family Medicine

## 2022-07-14 NOTE — Telephone Encounter (Signed)
Request is for 1 year.

## 2022-08-25 ENCOUNTER — Ambulatory Visit: Payer: 59 | Admitting: Family Medicine

## 2022-08-25 ENCOUNTER — Encounter: Payer: Self-pay | Admitting: Family Medicine

## 2022-08-25 VITALS — BP 122/70 | HR 87 | Temp 97.4°F | Ht 64.0 in | Wt 169.0 lb

## 2022-08-25 DIAGNOSIS — E1169 Type 2 diabetes mellitus with other specified complication: Secondary | ICD-10-CM | POA: Diagnosis not present

## 2022-08-25 DIAGNOSIS — N3941 Urge incontinence: Secondary | ICD-10-CM | POA: Diagnosis not present

## 2022-08-25 DIAGNOSIS — Z6830 Body mass index (BMI) 30.0-30.9, adult: Secondary | ICD-10-CM

## 2022-08-25 DIAGNOSIS — N951 Menopausal and female climacteric states: Secondary | ICD-10-CM

## 2022-08-25 DIAGNOSIS — E6609 Other obesity due to excess calories: Secondary | ICD-10-CM

## 2022-08-25 DIAGNOSIS — E559 Vitamin D deficiency, unspecified: Secondary | ICD-10-CM

## 2022-08-25 DIAGNOSIS — Z23 Encounter for immunization: Secondary | ICD-10-CM | POA: Diagnosis not present

## 2022-08-25 DIAGNOSIS — E039 Hypothyroidism, unspecified: Secondary | ICD-10-CM

## 2022-08-25 DIAGNOSIS — R7303 Prediabetes: Secondary | ICD-10-CM | POA: Diagnosis not present

## 2022-08-25 DIAGNOSIS — E785 Hyperlipidemia, unspecified: Secondary | ICD-10-CM

## 2022-08-25 DIAGNOSIS — E782 Mixed hyperlipidemia: Secondary | ICD-10-CM

## 2022-08-25 LAB — POCT URINALYSIS DIP (CLINITEK)
Bilirubin, UA: NEGATIVE
Blood, UA: NEGATIVE
Glucose, UA: NEGATIVE mg/dL
Ketones, POC UA: NEGATIVE mg/dL
Leukocytes, UA: NEGATIVE
Nitrite, UA: NEGATIVE
POC PROTEIN,UA: NEGATIVE
Spec Grav, UA: 1.015 (ref 1.010–1.025)
Urobilinogen, UA: NEGATIVE E.U./dL — AB
pH, UA: 5.5 (ref 5.0–8.0)

## 2022-08-25 MED ORDER — SYNTHROID 50 MCG PO TABS: 50.0000 ug | ORAL_TABLET | Freq: Every day | ORAL | 3 refills | Status: DC

## 2022-08-25 MED ORDER — VEOZAH 45 MG PO TABS: 45.0000 mg | ORAL_TABLET | Freq: Every day | ORAL | 1 refills | Status: DC

## 2022-08-25 NOTE — Patient Instructions (Signed)
START VEOZAH ONCE DAILY.  LIKELY RESTART CRESTOR 5 MG BEFORE BED

## 2022-08-25 NOTE — Progress Notes (Unsigned)
Subjective:  Patient ID: Judith Clark, female    DOB: [birth year]  Age: 56 y.o. MRN: [id number]  Chief Complaint  Patient presents with   Hyperlipidemia   Gastroesophageal Reflux   HPI:  Hyperlipidemia Pertinent negatives include no chest pain, myalgias or shortness of breath.  Gastroesophageal Reflux She complains of nausea. She reports no abdominal pain, no chest pain, no coughing or no sore throat. Pertinent negatives include no fatigue.   Hypothyroidism: on synthroid 50 mcg once daily in am. Brand name working. ON generic caused hair loss and dry skin/itchy skin. Thyroid levels normal in July.  Hyperlipidemia: Stopped taking crestor 5 mg once daily because was feeling severely fatigued.  Prediabetes: A1C 5.9. stopped metformin because was feeling severely fatigued.  Vitamin D deficiency: takes MVI and biotin.  Chronic idiopathic constipation: improved with decaf coffee.  GERD: pepcid 20 mg one daily. Working on [employer].  cOMPLAINING OF HOT FLASHES   Current Outpatient Medications on File Prior to Visit  Medication Sig Dispense Refill   acetaminophen (TYLENOL) 500 MG tablet Take 1,000 mg by mouth every 6 (six) hours as needed for mild pain, moderate pain or headache.     famotidine (PEPCID) 20 MG tablet Take 1 tablet (20 mg total) by mouth 2 (two) times daily. 30 tablet 0   fluticasone (FLONASE) 50 MCG/ACT nasal spray Place 2 sprays into both nostrils daily. 16 g 6   Multiple Vitamins-Minerals (MULTIVITAMIN ADULT PO) Take 1 tablet by mouth daily.     ondansetron (ZOFRAN) 4 MG tablet Take 1 tablet (4 mg total) by mouth every 8 (eight) hours as needed for nausea or vomiting. 20 tablet 0   No current facility-administered medications on file prior to visit.   Past Medical History:  Diagnosis Date   Depression    GERD (gastroesophageal reflux disease)    Hyperlipidemia    Hypothyroidism    Mobitz type 1 second degree atrioventricular block    unknown if Mobitz 1 or 2.    Past Surgical History:  Procedure Laterality Date   ABDOMINAL HYSTERECTOMY     bladder tacking      Family History  Problem Relation Age of Onset   Breast cancer Mother    Hypertension Mother    Diabetes Father    Social History   Socioeconomic History   Marital status: Married    Spouse name: Not on file   Number of children: Not on file   Years of education: Not on file   Highest education level: Not on file  Occupational History   Occupation: Charity fundraiser    Comment: [employer]  Tobacco Use   Smoking status: Former    Packs/day: 1.00    Years: 1.00    Total pack years: 1.00    Types: Cigarettes   Smokeless tobacco: Never  Vaping Use   Vaping Use: Never used  Substance and Sexual Activity   Alcohol use: Not Currently   Drug use: Never   Sexual activity: Not Currently    Birth control/protection: Post-menopausal  Other Topics Concern   Not on file  Social History Narrative   Not on file   Social Determinants of Health   Financial Resource Strain: Not on file  Food Insecurity: Not on file  Transportation Needs: Not on file  Physical Activity: Not on file  Stress: Not on file  Social Connections: Not on file    Review of Systems  Constitutional:  Negative for chills and fatigue.  HENT:  Negative for congestion, ear pain, nosebleeds, sinus pain and sore throat.   Respiratory:  Negative for cough and shortness of breath.   Cardiovascular:  Negative for chest pain and leg swelling.  Gastrointestinal:  Positive for nausea. Negative for abdominal pain, constipation, diarrhea and vomiting.  Genitourinary:  Positive for urgency. Negative for dysuria and frequency.  Musculoskeletal:  Positive for back pain. Negative for arthralgias and myalgias.  Neurological:  Negative for dizziness and headaches.     Objective:  BP 122/70   Pulse 87   Temp (!) 97.4 F (36.3 C)   Ht 5\' 4"  (1.626 m)   Wt 169 lb (76.7 kg)   SpO2 97%   BMI 29.01 kg/m       08/25/2022    7:54 AM 05/18/2022    9:26 AM 01/30/2022    8:22 AM  BP/Weight  Systolic BP 122 110 108  Diastolic BP 70 78 78  Wt. (Lbs) 169 162 167  BMI 29.01 kg/m2 27.81 kg/m2 28.67 kg/m2    Physical Exam Vitals reviewed.  Constitutional:      Appearance: Normal appearance. She is normal weight.  Neck:     Vascular: No carotid bruit.  Cardiovascular:     Rate and Rhythm: Normal rate and regular rhythm.     Heart sounds: Normal heart sounds.  Pulmonary:     Effort: Pulmonary effort is normal. No respiratory distress.     Breath sounds: Normal breath sounds.  Abdominal:     General: Abdomen is flat. Bowel sounds are normal.     Palpations: Abdomen is soft.     Tenderness: There is no abdominal tenderness.  Neurological:     Mental Status: She is alert and oriented to person, place, and time.  Psychiatric:        Mood and Affect: Mood normal.        Behavior: Behavior normal.     Diabetic Foot Exam - Simple   No data filed      Lab Results  Component Value Date   WBC 8.5 08/25/2022   HGB 13.3 08/25/2022   HCT 41.6 08/25/2022   PLT 392 08/25/2022   GLUCOSE 109 (H) 08/25/2022   CHOL 221 (H) 08/25/2022   TRIG 116 08/25/2022   HDL 47 08/25/2022   LDLCALC 153 (H) 08/25/2022   ALT 42 (H) 08/25/2022   AST 32 08/25/2022   NA 139 08/25/2022   K 4.4 08/25/2022   CL 105 08/25/2022   CREATININE 0.93 08/25/2022   BUN 14 08/25/2022   CO2 17 (L) 08/25/2022   TSH 3.110 08/25/2022   INR 1.0 07/19/2007   HGBA1C 5.9 (H) 08/25/2022      Assessment & Plan:   Problem List Items Addressed This Visit       Endocrine   Hypothyroidism    Previously well controlled Continue Synthroid at current dose  Recheck TSH and adjust Synthroid as indicated        Relevant Medications   SYNTHROID 50 MCG tablet   Other Relevant Orders   TSH (Completed)   Hyperlipidemia associated with type 2 diabetes mellitus (HCC)    Restart crestor 5 mg before bed.  Continue to work on  eating a healthy diet and exercise.  Labs drawn today.        Relevant Orders   Lipid panel (Completed)   CBC with Differential/Platelet (Completed)   Comprehensive metabolic panel (Completed)     Other   Class 1 obesity due to excess calories with  serious comorbidity and body mass index (BMI) of 30.0 to 30.9 in adult    Recommend continue to work on eating healthy diet and exercise.       Vitamin D deficiency    Check vitamin D      Prediabetes    Recommend continue to work on eating healthy diet and exercise.       Relevant Orders   Hemoglobin A1c (Completed)   Urgency incontinence - Primary   Relevant Orders   POCT URINALYSIS DIP (CLINITEK) (Completed)   Encounter for immunization   Relevant Orders   Flu Vaccine MDCK QUAD PF (Completed)   Menopausal symptoms    START VEOZAH ONCE DAILY.      .  Meds ordered this encounter  Medications   SYNTHROID 50 MCG tablet    Sig: Take 1 tablet (50 mcg total) by mouth daily before breakfast.    Dispense:  90 tablet    Refill:  3    BRAND NAME MEDICALLY NECESSARY.   Fezolinetant (VEOZAH) 45 MG TABS    Sig: Take 45 mg by mouth daily.    Dispense:  90 tablet    Refill:  1    Orders Placed This Encounter  Procedures   Flu Vaccine MDCK QUAD PF   Lipid panel   TSH   CBC with Differential/Platelet   Comprehensive metabolic panel   Hemoglobin A1c   Cardiovascular Risk Assessment   POCT URINALYSIS DIP (CLINITEK)     Follow-up: Return in about 3 months (around 11/25/2022) for chronic fasting.  An After Visit Summary was printed and given to the patient.  Rochel Brome, MD [facility] [phone]

## 2022-08-26 LAB — CBC WITH DIFFERENTIAL/PLATELET
Basophils Absolute: 0 10*3/uL (ref 0.0–0.2)
Basos: 1 %
EOS (ABSOLUTE): 0.2 10*3/uL (ref 0.0–0.4)
Eos: 3 %
Hematocrit: 41.6 % (ref 34.0–46.6)
Hemoglobin: 13.3 g/dL (ref 11.1–15.9)
Immature Grans (Abs): 0 10*3/uL (ref 0.0–0.1)
Immature Granulocytes: 0 %
Lymphocytes Absolute: 2.9 10*3/uL (ref 0.7–3.1)
Lymphs: 34 %
MCH: 26.8 pg (ref 26.6–33.0)
MCHC: 32 g/dL (ref 31.5–35.7)
MCV: 84 fL (ref 79–97)
Monocytes Absolute: 0.7 10*3/uL (ref 0.1–0.9)
Monocytes: 8 %
Neutrophils Absolute: 4.7 10*3/uL (ref 1.4–7.0)
Neutrophils: 54 %
Platelets: 392 10*3/uL (ref 150–450)
RBC: 4.96 x10E6/uL (ref 3.77–5.28)
RDW: 13.3 % (ref 11.7–15.4)
WBC: 8.5 10*3/uL (ref 3.4–10.8)

## 2022-08-26 LAB — LIPID PANEL
Chol/HDL Ratio: 4.7 ratio — ABNORMAL HIGH (ref 0.0–4.4)
Cholesterol, Total: 221 mg/dL — ABNORMAL HIGH (ref 100–199)
HDL: 47 mg/dL (ref >39)
LDL Chol Calc (NIH): 153 mg/dL — ABNORMAL HIGH (ref 0–99)
Triglycerides: 116 mg/dL (ref 0–149)
VLDL Cholesterol Cal: 21 mg/dL (ref 5–40)

## 2022-08-26 LAB — COMPREHENSIVE METABOLIC PANEL
ALT: 42 IU/L — ABNORMAL HIGH (ref 0–32)
AST: 32 IU/L (ref 0–40)
Albumin/Globulin Ratio: 1.8 (ref 1.2–2.2)
Albumin: 4.5 g/dL (ref 3.8–4.9)
Alkaline Phosphatase: 102 IU/L (ref 44–121)
BUN/Creatinine Ratio: 15 (ref 9–23)
BUN: 14 mg/dL (ref 6–24)
Bilirubin Total: 0.2 mg/dL (ref 0.0–1.2)
CO2: 17 mmol/L — ABNORMAL LOW (ref 20–29)
Calcium: 9.2 mg/dL (ref 8.7–10.2)
Chloride: 105 mmol/L (ref 96–106)
Creatinine, Ser: 0.93 mg/dL (ref 0.57–1.00)
Globulin, Total: 2.5 g/dL (ref 1.5–4.5)
Glucose: 109 mg/dL — ABNORMAL HIGH (ref 70–99)
Potassium: 4.4 mmol/L (ref 3.5–5.2)
Sodium: 139 mmol/L (ref 134–144)
Total Protein: 7 g/dL (ref 6.0–8.5)
eGFR: 73 mL/min/{1.73_m2} (ref >59)

## 2022-08-26 LAB — TSH: TSH: 3.11 u[IU]/mL (ref 0.450–4.500)

## 2022-08-26 LAB — CARDIOVASCULAR RISK ASSESSMENT

## 2022-08-26 LAB — HEMOGLOBIN A1C
Est. average glucose Bld gHb Est-mCnc: 123 mg/dL
Hgb A1c MFr Bld: 5.9 % — ABNORMAL HIGH (ref 4.8–5.6)

## 2022-08-27 DIAGNOSIS — N3941 Urge incontinence: Secondary | ICD-10-CM | POA: Insufficient documentation

## 2022-08-27 DIAGNOSIS — Z23 Encounter for immunization: Secondary | ICD-10-CM | POA: Insufficient documentation

## 2022-08-27 DIAGNOSIS — N951 Menopausal and female climacteric states: Secondary | ICD-10-CM | POA: Insufficient documentation

## 2022-08-27 NOTE — Assessment & Plan Note (Signed)
Check vitamin D. 

## 2022-08-27 NOTE — Assessment & Plan Note (Addendum)
Restart crestor 10 mg before bed.  Continue to work on eating a healthy diet and exercise.  Labs drawn today.

## 2022-08-27 NOTE — Progress Notes (Signed)
Blood count normal.  Liver function normal.  Kidney function normal.  Thyroid function normal.  Cholesterol: still quite high. Recommend start crestor 10 mg before bed.  HBA1C: 5.9. stable

## 2022-08-27 NOTE — Assessment & Plan Note (Signed)
START VEOZAH ONCE DAILY.

## 2022-08-27 NOTE — Assessment & Plan Note (Signed)
Previously well controlled Continue Synthroid at current dose  Recheck TSH and adjust Synthroid as indicated   

## 2022-08-27 NOTE — Assessment & Plan Note (Signed)
Recommend continue to work on eating healthy diet and exercise.  

## 2022-09-18 ENCOUNTER — Telehealth: Payer: Self-pay

## 2022-09-18 NOTE — Telephone Encounter (Signed)
Patient came in stating that she never received her name brand Synthroid and to please send to [facility] on [address] also she states that she can't take Crestor because of red dye and she is breaking out on abdomen.  Whichever you decide to change to please send to [facility] as well. FYI She took her last Synthroid today.

## 2022-10-05 ENCOUNTER — Ambulatory Visit (INDEPENDENT_AMBULATORY_CARE_PROVIDER_SITE_OTHER): Payer: 59 | Admitting: Nurse Practitioner

## 2022-10-05 ENCOUNTER — Encounter: Payer: Self-pay | Admitting: Nurse Practitioner

## 2022-10-05 VITALS — BP 122/80 | HR 99 | Temp 97.2°F | Wt 169.0 lb

## 2022-10-05 DIAGNOSIS — R509 Fever, unspecified: Secondary | ICD-10-CM | POA: Diagnosis not present

## 2022-10-05 DIAGNOSIS — R051 Acute cough: Secondary | ICD-10-CM | POA: Diagnosis not present

## 2022-10-05 DIAGNOSIS — H6502 Acute serous otitis media, left ear: Secondary | ICD-10-CM | POA: Diagnosis not present

## 2022-10-05 LAB — POCT INFLUENZA A/B
Influenza A, POC: NEGATIVE
Influenza B, POC: NEGATIVE

## 2022-10-05 LAB — POC COVID19 BINAXNOW: SARS Coronavirus 2 Ag: NEGATIVE

## 2022-10-05 MED ORDER — AZITHROMYCIN 250 MG PO TABS: ORAL_TABLET | ORAL | 0 refills | Status: AC

## 2022-10-05 MED ORDER — ONDANSETRON HCL 4 MG PO TABS: 4.0000 mg | ORAL_TABLET | Freq: Three times a day (TID) | ORAL | 0 refills | Status: DC | PRN

## 2022-10-05 MED ORDER — PROMETHAZINE-DM 6.25-15 MG/5ML PO SYRP: 5.0000 mL | ORAL_SOLUTION | Freq: Four times a day (QID) | ORAL | 0 refills | Status: DC | PRN

## 2022-10-05 NOTE — Patient Instructions (Addendum)
Take antibiotics as prescribed Take Promethazine-DM up to 4 times daily for cough/sinus congestion Follow-up as needed   Sinus Infection, Adult A sinus infection is soreness and swelling (inflammation) of your sinuses. Sinuses are hollow spaces in the bones around your face. They are located: Around your eyes. In the middle of your forehead. Behind your nose. In your cheekbones. Your sinuses and nasal passages are lined with a fluid called mucus. Mucus drains out of your sinuses. Swelling can trap mucus in your sinuses. This lets germs (bacteria, virus, or fungus) grow, which leads to infection. Most of the time, this condition is caused by a virus. What are the causes? Allergies. Asthma. Germs. Things that block your nose or sinuses. Growths in the nose (nasal polyps). Chemicals or irritants in the air. A fungus. This is rare. What increases the risk? Having a weak body defense system (immune system). Doing a lot of swimming or diving. Using nasal sprays too much. Smoking. What are the signs or symptoms? The main symptoms of this condition are pain and a feeling of pressure around the sinuses. Other symptoms include: Stuffy nose (congestion). This may make it hard to breathe through your nose. Runny nose (drainage). Soreness, swelling, and warmth in the sinuses. A cough that may get worse at night. Being unable to smell and taste. Mucus that collects in the throat or the back of the nose (postnasal drip). This may cause a sore throat or bad breath. Being very tired (fatigued). A fever. How is this diagnosed? Your symptoms. Your medical history. A physical exam. Tests to find out if your condition is short-term (acute) or long-term (chronic). Your doctor may: Check your nose for growths (polyps). Check your sinuses using a tool that has a light on one end (endoscope). Check for allergies or germs. Do imaging tests, such as an MRI or CT scan. How is this treated? Treatment  for this condition depends on the cause and whether it is short-term or long-term. If caused by a virus, your symptoms should go away on their own within 10 days. You may be given medicines to relieve symptoms. They include: Medicines that shrink swollen tissue in the nose. A spray that treats swelling of the nostrils. Rinses that help get rid of thick mucus in your nose (nasal saline washes). Medicines that treat allergies (antihistamines). Over-the-counter pain relievers. If caused by bacteria, your doctor may wait to see if you will get better without treatment. You may be given antibiotic medicine if you have: A very bad infection. A weak body defense system. If caused by growths in the nose, surgery may be needed. Follow these instructions at home: Medicines Take, use, or apply over-the-counter and prescription medicines only as told by your doctor. These may include nasal sprays. If you were prescribed an antibiotic medicine, take it as told by your doctor. Do not stop taking it even if you start to feel better. Hydrate and humidify  Drink enough water to keep your pee (urine) pale yellow. Use a cool mist humidifier to keep the humidity level in your home above 50%. Breathe in steam for 10-15 minutes, 3-4 times a day, or as told by your doctor. You can do this in the bathroom while a hot shower is running. Try not to spend time in cool or dry air. Rest Rest as much as you can. Sleep with your head raised (elevated). Make sure you get enough sleep each night. General instructions  Put a warm, moist washcloth on your face  3-4 times a day, or as often as told by your doctor. Use nasal saline washes as often as told by your doctor. Wash your hands often with soap and water. If you cannot use soap and water, use hand sanitizer. Do not smoke. Avoid being around people who are smoking (secondhand smoke). Keep all follow-up visits. Contact a doctor if: You have a fever. Your symptoms  get worse. Your symptoms do not get better within 10 days. Get help right away if: You have a very bad headache. You cannot stop vomiting. You have very bad pain or swelling around your face or eyes. You have trouble seeing. You feel confused. Your neck is stiff. You have trouble breathing. These symptoms may be an emergency. Get help right away. Call 911. Do not wait to see if the symptoms will go away. Do not drive yourself to the hospital. Summary A sinus infection is swelling of your sinuses. Sinuses are hollow spaces in the bones around your face. This condition is caused by tissues in your nose that become inflamed or swollen. This traps germs. These can lead to infection. If you were prescribed an antibiotic medicine, take it as told by your doctor. Do not stop taking it even if you start to feel better. Keep all follow-up visits. This information is not intended to replace advice given to you by your health care provider. Make sure you discuss any questions you have with your health care provider. Document Revised: 09/13/2021 Document Reviewed: 09/13/2021 Elsevier Patient Education  [place].  Otitis Media, Adult  Otitis media occurs when there is inflammation and fluid in the middle ear with signs and symptoms of an acute infection. The middle ear is a part of the ear that contains bones for hearing as well as air that helps send sounds to the brain. When infected fluid builds up in this space, it causes pressure and can lead to an ear infection. The eustachian tube connects the middle ear to the back of the nose (nasopharynx) and normally allows air into the middle ear. If the eustachian tube becomes blocked, fluid can build up and become infected. What are the causes? This condition is caused by a blockage in the eustachian tube. This can be caused by mucus or by swelling of the tube. Problems that can cause a blockage include: A cold or other upper respiratory  infection. Allergies. An irritant, such as tobacco smoke. Enlarged adenoids. The adenoids are areas of soft tissue located high in the back of the throat, behind the nose and the roof of the mouth. They are part of the body's defense system (immune system). A mass in the nasopharynx. Damage to the ear caused by pressure changes (barotrauma). What increases the risk? You are more likely to develop this condition if you: Smoke or are exposed to tobacco smoke. Have an opening in the roof of your mouth (cleft palate). Have gastroesophageal reflux. Have an immune system disorder. What are the signs or symptoms? Symptoms of this condition include: Ear pain. Fever. Decreased hearing. Tiredness (lethargy). Fluid leaking from the ear, if the eardrum is ruptured or has burst. Ringing in the ear. How is this diagnosed?  This condition is diagnosed with a physical exam. During the exam, your health care provider will use an instrument called an otoscope to look in your ear and check for redness, swelling, and fluid. He or she will also ask about your symptoms. Your health care provider may also order tests, such as:  A pneumatic otoscopy. This is a test to check the movement of the eardrum. It is done by squeezing a small amount of air into the ear. A tympanogram. This is a test that shows how well the eardrum moves in response to air pressure in the ear canal. It provides a graph for your health care provider to review. How is this treated? This condition can go away on its own within 3-5 days. But if the condition is caused by a bacterial infection and does not go away on its own, or if it keeps coming back, your health care provider may: Prescribe antibiotic medicine to treat the infection. Prescribe or recommend medicines to control pain. Follow these instructions at home: Take over-the-counter and prescription medicines only as told by your health care provider. If you were prescribed an  antibiotic medicine, take it as told by your health care provider. Do not stop taking the antibiotic even if you start to feel better. Keep all follow-up visits. This is important. Contact a health care provider if: You have bleeding from your nose. There is a lump on your neck. You are not feeling better in 5 days. You feel worse instead of better. Get help right away if: You have severe pain that is not controlled with medicine. You have swelling, redness, or pain around your ear. You have stiffness in your neck. A part of your face is not moving (paralyzed). The bone behind your ear (mastoid bone) is tender when you touch it. You develop a severe headache. Summary Otitis media is redness, soreness, and swelling of the middle ear, usually resulting in pain and decreased hearing. This condition can go away on its own within 3-5 days. If the problem does not go away in 3-5 days, your health care provider may give you medicines to treat the infection. If you were prescribed an antibiotic medicine, take it as told by your health care provider. Follow all instructions that were given to you by your health care provider. This information is not intended to replace advice given to you by your health care provider. Make sure you discuss any questions you have with your health care provider. Document Revised: 01/17/2021 Document Reviewed: 01/17/2021 Elsevier Patient Education  [place].

## 2022-10-05 NOTE — Progress Notes (Signed)
Acute Office Visit  Subjective:    Patient ID: Judith Clark, female    DOB: [birth year], 56 y.o.   MRN: [id number]  Chief Complaint  Patient presents with   URI    HPI: Patient is in today for Upper respiratory symptoms She complains of sinus congestion, fever, body aches, sore throat, wheezing, and vomiting.Onset of symptoms was a few hours ago and staying constant.She is drinking plenty of fluids.  Past history is significant for occasional episodes of bronchitis. Patient is non-smoker.   Past Medical History:  Diagnosis Date   Depression    GERD (gastroesophageal reflux disease)    Hyperlipidemia    Hypothyroidism    Mobitz type 1 second degree atrioventricular block    unknown if Mobitz 1 or 2.    Past Surgical History:  Procedure Laterality Date   ABDOMINAL HYSTERECTOMY     bladder tacking      Family History  Problem Relation Age of Onset   Breast cancer Mother    Hypertension Mother    Diabetes Father     Social History   Socioeconomic History   Marital status: Married    Spouse name: Not on file   Number of children: Not on file   Years of education: Not on file   Highest education level: Not on file  Occupational History   Occupation: Therapist, sports    Comment: [employer]  Tobacco Use   Smoking status: Former    Packs/day: 1.00    Years: 1.00    Total pack years: 1.00    Types: Cigarettes   Smokeless tobacco: Never  Vaping Use   Vaping Use: Never used  Substance and Sexual Activity   Alcohol use: Not Currently   Drug use: Never   Sexual activity: Not Currently    Birth control/protection: Post-menopausal  Other Topics Concern   Not on file  Social History Narrative   Not on file   Social Determinants of Health   Financial Resource Strain: Not on file  Food Insecurity: Not on file  Transportation Needs: Not on file  Physical Activity: Not on file  Stress: Not on file  Social Connections: Not on file  Intimate Partner Violence: Not  on file    Outpatient Medications Prior to Visit  Medication Sig Dispense Refill   acetaminophen (TYLENOL) 500 MG tablet Take 1,000 mg by mouth every 6 (six) hours as needed for mild pain, moderate pain or headache.     famotidine (PEPCID) 20 MG tablet Take 1 tablet (20 mg total) by mouth 2 (two) times daily. 30 tablet 0   Fezolinetant (VEOZAH) 45 MG TABS Take 45 mg by mouth daily. 90 tablet 1   fluticasone (FLONASE) 50 MCG/ACT nasal spray Place 2 sprays into both nostrils daily. 16 g 6   Multiple Vitamins-Minerals (MULTIVITAMIN ADULT PO) Take 1 tablet by mouth daily.     ondansetron (ZOFRAN) 4 MG tablet Take 1 tablet (4 mg total) by mouth every 8 (eight) hours as needed for nausea or vomiting. 20 tablet 0   SYNTHROID 50 MCG tablet Take 1 tablet (50 mcg total) by mouth daily before breakfast. 90 tablet 3   No facility-administered medications prior to visit.    Allergies  Allergen Reactions   Codeine     n/v   Gluten Meal Other (See Comments)   Iodinated Contrast Media     itching Other reaction(s): rapid heart rate itching    Other Other (See Comments)    Angioedema  Red Dye     Migraines, Vomit   Shellfish Allergy     Angioedema   Shellfish-Derived Products     Other reaction(s): hives    Review of Systems    See pertinent positives and negatives per HPI.  Objective:    Physical Exam Vitals reviewed.  Constitutional:      Appearance: She is ill-appearing.  HENT:     Right Ear: Tympanic membrane normal. Tenderness present.     Left Ear: Tenderness present. Tympanic membrane is erythematous.     Nose: Congestion and rhinorrhea present.     Mouth/Throat:     Pharynx: Posterior oropharyngeal erythema present.  Cardiovascular:     Rate and Rhythm: Normal rate and regular rhythm.     Pulses: Normal pulses.     Heart sounds: Normal heart sounds.  Pulmonary:     Effort: Pulmonary effort is normal.     Breath sounds: Normal breath sounds.  Lymphadenopathy:      Cervical: Cervical adenopathy present.  Skin:    General: Skin is warm and dry.  Neurological:     Mental Status: She is alert and oriented to person, place, and time.  Psychiatric:        Behavior: Behavior normal.     BP 122/80   Pulse 99   Temp (!) 97.2 F (36.2 C)   Wt 169 lb (76.7 kg)   SpO2 97%   BMI 29.01 kg/m  Wt Readings from Last 3 Encounters:  10/05/22 169 lb (76.7 kg)  08/25/22 169 lb (76.7 kg)  05/18/22 162 lb (73.5 kg)    Health Maintenance Due  Topic Date Due   FOOT EXAM  Never done   OPHTHALMOLOGY EXAM  Never done   Diabetic kidney evaluation - Urine ACR  Never done   DTaP/Tdap/Td (2 - Tdap) 02/14/2009   Zoster Vaccines- Shingrix (1 of 2) Never done   COVID-19 Vaccine (3 - 2023-24 season) 06/23/2022    Lab Results  Component Value Date   TSH 3.110 08/25/2022   Lab Results  Component Value Date   WBC 8.5 08/25/2022   HGB 13.3 08/25/2022   HCT 41.6 08/25/2022   MCV 84 08/25/2022   PLT 392 08/25/2022   Lab Results  Component Value Date   NA 139 08/25/2022   K 4.4 08/25/2022   CO2 17 (L) 08/25/2022   GLUCOSE 109 (H) 08/25/2022   BUN 14 08/25/2022   CREATININE 0.93 08/25/2022   BILITOT 0.2 08/25/2022   ALKPHOS 102 08/25/2022   AST 32 08/25/2022   ALT 42 (H) 08/25/2022   PROT 7.0 08/25/2022   ALBUMIN 4.5 08/25/2022   CALCIUM 9.2 08/25/2022   ANIONGAP 8 06/08/2016   EGFR 73 08/25/2022   Lab Results  Component Value Date   CHOL 221 (H) 08/25/2022   Lab Results  Component Value Date   HDL 47 08/25/2022   Lab Results  Component Value Date   LDLCALC 153 (H) 08/25/2022   Lab Results  Component Value Date   TRIG 116 08/25/2022   Lab Results  Component Value Date   CHOLHDL 4.7 (H) 08/25/2022   Lab Results  Component Value Date   HGBA1C 5.9 (H) 08/25/2022       Assessment & Plan:    1. Non-recurrent acute serous otitis media of left ear - azithromycin (ZITHROMAX) 250 MG tablet; Take 2 tablets on day 1, then 1 tablet daily  on days 2 through 5  Dispense: 6 tablet; Refill: 0 - ondansetron (  ZOFRAN) 4 MG tablet; Take 1 tablet (4 mg total) by mouth every 8 (eight) hours as needed for nausea or vomiting.  Dispense: 20 tablet; Refill: 0  2. Fever, unspecified fever cause - POC COVID-19 BinaxNow-NEGATIVE, recommend re-test in 48 hours - POCT Influenza A/B-NEGATIVE  3. Acute cough - promethazine-dextromethorphan (PROMETHAZINE-DM) 6.25-15 MG/5ML syrup; Take 5 mLs by mouth 4 (four) times daily as needed.  Dispense: 118 mL; Refill: 0   Take antibiotics as prescribed Take Promethazine-DM up to 4 times daily for cough/sinus congestion Follow-up as needed      Follow-up: PRN  An After Visit Summary was printed and given to the patient.  I, Rip Harbour, NP, have reviewed all documentation for this visit. The documentation on 10/05/22 for the exam, diagnosis, procedures, and orders are all accurate and complete.   Signed, Rip Harbour, NP [place] [phone]

## 2022-10-30 ENCOUNTER — Ambulatory Visit (INDEPENDENT_AMBULATORY_CARE_PROVIDER_SITE_OTHER): Payer: 59 | Admitting: Family Medicine

## 2022-10-30 ENCOUNTER — Encounter: Payer: Self-pay | Admitting: Family Medicine

## 2022-10-30 VITALS — BP 120/70 | HR 80 | Temp 97.2°F | Resp 16 | Ht 64.0 in | Wt 168.0 lb

## 2022-10-30 DIAGNOSIS — R052 Subacute cough: Secondary | ICD-10-CM | POA: Insufficient documentation

## 2022-10-30 DIAGNOSIS — J208 Acute bronchitis due to other specified organisms: Secondary | ICD-10-CM

## 2022-10-30 HISTORY — DX: Acute bronchitis due to other specified organisms: J20.8

## 2022-10-30 MED ORDER — PREDNISONE 10 MG PO TABS: ORAL_TABLET | ORAL | 0 refills | Status: AC

## 2022-10-30 NOTE — Progress Notes (Signed)
Acute Office Visit  Subjective:    Patient ID: Judith Clark, female    DOB: 09-17-1966, 57 y.o.   MRN: [id number]  Chief Complaint  Patient presents with   Cough    HPI: Patient is in today for cough and chest congestion.  She completed a 7 day course of Augmentin on Saturday but continues to have cough and congestion.  She is concerned that she had pneumonia and wanted to come in for follow-up since she did not have a chest xray.   Prior to this took zithromax for sinus infection in mid December.  Wheezing yesterday.   Sore throat, rhinorrhea, fever have resolved.  Flu negative. Covid 19 negative.   Past Medical History:  Diagnosis Date   Depression    GERD (gastroesophageal reflux disease)    Hyperlipidemia    Hypothyroidism    Mobitz type 1 second degree atrioventricular block    unknown if Mobitz 1 or 2.    Past Surgical History:  Procedure Laterality Date   ABDOMINAL HYSTERECTOMY     bladder tacking      Family History  Problem Relation Age of Onset   Breast cancer Mother    Hypertension Mother    Diabetes Father     Social History   Socioeconomic History   Marital status: Married    Spouse name: Not on file   Number of children: Not on file   Years of education: Not on file   Highest education level: Not on file  Occupational History   Occupation: Charity fundraiser    Comment: [employer]  Tobacco Use   Smoking status: Former    Packs/day: 1.00    Years: 1.00    Total pack years: 1.00    Types: Cigarettes   Smokeless tobacco: Never  Vaping Use   Vaping Use: Never used  Substance and Sexual Activity   Alcohol use: Not Currently   Drug use: Never   Sexual activity: Not Currently    Birth control/protection: Post-menopausal  Other Topics Concern   Not on file  Social History Narrative   Not on file   Social Determinants of Health   Financial Resource Strain: Not on file  Food Insecurity: Not on file  Transportation Needs: Not on file   Physical Activity: Not on file  Stress: Not on file  Social Connections: Not on file  Intimate Partner Violence: Not on file    Outpatient Medications Prior to Visit  Medication Sig Dispense Refill   acetaminophen (TYLENOL) 500 MG tablet Take 1,000 mg by mouth every 6 (six) hours as needed for mild pain, moderate pain or headache.     albuterol (VENTOLIN HFA) 108 (90 Base) MCG/ACT inhaler SMARTSIG:2 Puff(s) By Mouth Every 4 Hours PRN     famotidine (PEPCID) 20 MG tablet Take 1 tablet (20 mg total) by mouth 2 (two) times daily. 30 tablet 0   Fezolinetant (VEOZAH) 45 MG TABS Take 45 mg by mouth daily. 90 tablet 1   fluticasone (FLONASE) 50 MCG/ACT nasal spray Place 2 sprays into both nostrils daily. 16 g 6   Multiple Vitamins-Minerals (MULTIVITAMIN ADULT PO) Take 1 tablet by mouth daily.     ondansetron (ZOFRAN) 4 MG tablet Take 1 tablet (4 mg total) by mouth every 8 (eight) hours as needed for nausea or vomiting. 20 tablet 0   promethazine-dextromethorphan (PROMETHAZINE-DM) 6.25-15 MG/5ML syrup Take 5 mLs by mouth 4 (four) times daily as needed. 118 mL 0   SYNTHROID 50 MCG  tablet Take 1 tablet (50 mcg total) by mouth daily before breakfast. 90 tablet 3   ondansetron (ZOFRAN) 4 MG tablet Take 1 tablet (4 mg total) by mouth every 8 (eight) hours as needed for nausea or vomiting. 20 tablet 0   No facility-administered medications prior to visit.    Allergies  Allergen Reactions   Codeine     n/v   Gluten Meal Other (See Comments)   Iodinated Contrast Media     itching Other reaction(s): rapid heart rate itching    Other Other (See Comments)    Angioedema   Red Dye     Migraines, Vomit   Shellfish Allergy     Angioedema   Shellfish-Derived Products     Other reaction(s): hives    Review of Systems  Constitutional:  Positive for fatigue. Negative for chills and fever.  HENT:  Negative for congestion, rhinorrhea and sore throat.   Respiratory:  Positive for cough, shortness of  breath and wheezing.   Cardiovascular:  Negative for chest pain.  Gastrointestinal:  Negative for abdominal pain, constipation, diarrhea, nausea and vomiting.  Genitourinary:  Negative for dysuria and urgency.  Musculoskeletal:  Negative for back pain and myalgias.  Neurological:  Negative for dizziness, weakness, light-headedness and headaches.  Psychiatric/Behavioral:  Negative for dysphoric mood. The patient is not nervous/anxious.        Objective:    Physical Exam Vitals reviewed.  Constitutional:      Appearance: Normal appearance.  HENT:     Right Ear: Tympanic membrane, ear canal and external ear normal.     Left Ear: Tympanic membrane, ear canal and external ear normal.     Nose: Nose normal.     Mouth/Throat:     Pharynx: Oropharynx is clear.  Cardiovascular:     Rate and Rhythm: Normal rate and regular rhythm.     Heart sounds: Normal heart sounds. No murmur heard. Pulmonary:     Effort: Pulmonary effort is normal. No respiratory distress.     Breath sounds: Wheezing (mild) present.  Neurological:     Mental Status: She is alert and oriented to person, place, and time.  Psychiatric:        Mood and Affect: Mood normal.        Behavior: Behavior normal.     BP 120/70   Pulse 80   Temp (!) 97.2 F (36.2 C)   Resp 16   Ht 5\' 4"  (1.626 m)   Wt 168 lb (76.2 kg)   BMI 28.84 kg/m  Wt Readings from Last 3 Encounters:  10/30/22 168 lb (76.2 kg)  10/05/22 169 lb (76.7 kg)  08/25/22 169 lb (76.7 kg)    Health Maintenance Due  Topic Date Due   FOOT EXAM  Never done   OPHTHALMOLOGY EXAM  Never done   Diabetic kidney evaluation - Urine ACR  Never done   DTaP/Tdap/Td (2 - Tdap) 02/14/2009   Zoster Vaccines- Shingrix (1 of 2) Never done   COVID-19 Vaccine (3 - 2023-24 season) 06/23/2022    There are no preventive care reminders to display for this patient.   Lab Results  Component Value Date   TSH 3.110 08/25/2022   Lab Results  Component Value Date    WBC 8.5 08/25/2022   HGB 13.3 08/25/2022   HCT 41.6 08/25/2022   MCV 84 08/25/2022   PLT 392 08/25/2022   Lab Results  Component Value Date   NA 139 08/25/2022   K 4.4 08/25/2022  CO2 17 (L) 08/25/2022   GLUCOSE 109 (H) 08/25/2022   BUN 14 08/25/2022   CREATININE 0.93 08/25/2022   BILITOT 0.2 08/25/2022   ALKPHOS 102 08/25/2022   AST 32 08/25/2022   ALT 42 (H) 08/25/2022   PROT 7.0 08/25/2022   ALBUMIN 4.5 08/25/2022   CALCIUM 9.2 08/25/2022   ANIONGAP 8 06/08/2016   EGFR 73 08/25/2022   Lab Results  Component Value Date   CHOL 221 (H) 08/25/2022   Lab Results  Component Value Date   HDL 47 08/25/2022   Lab Results  Component Value Date   LDLCALC 153 (H) 08/25/2022   Lab Results  Component Value Date   TRIG 116 08/25/2022   Lab Results  Component Value Date   CHOLHDL 4.7 (H) 08/25/2022   Lab Results  Component Value Date   HGBA1C 5.9 (H) 08/25/2022       Assessment & Plan:   Problem List Items Addressed This Visit       Respiratory   Acute bronchitis due to other specified organisms - Primary    Order chest xray. If pneumonia present, recommend refill antibiotic.  Start on predpack taper.  Has albuterol hfa 2 puffs four times a day as needed.  Has cough syrup which can use at night.       Relevant Orders   DG Chest 2 View (Completed)   Meds ordered this encounter  Medications   predniSONE (DELTASONE) 10 MG tablet    Sig: Take 6 tablets (60 mg total) by mouth daily with breakfast for 1 day, THEN 5 tablets (50 mg total) daily with breakfast for 1 day, THEN 4 tablets (40 mg total) daily with breakfast for 1 day, THEN 3 tablets (30 mg total) daily with breakfast for 1 day, THEN 2 tablets (20 mg total) daily with breakfast for 1 day, THEN 1 tablet (10 mg total) daily with breakfast for 1 day.    Dispense:  21 tablet    Refill:  0    Orders Placed This Encounter  Procedures   DG Chest 2 View     Follow-up: No follow-ups on file.  An After  Visit Summary was printed and given to the patient.  Blane Ohara, MD [facility] [phone]

## 2022-10-30 NOTE — Assessment & Plan Note (Signed)
Order chest xray. If pneumonia present, recommend refill antibiotic.  Start on predpack taper.  Has albuterol hfa 2 puffs four times a day as needed.  Has cough syrup which can use at night.

## 2022-11-01 ENCOUNTER — Other Ambulatory Visit: Payer: Self-pay

## 2022-11-01 ENCOUNTER — Telehealth: Payer: Self-pay

## 2022-11-01 DIAGNOSIS — R052 Subacute cough: Secondary | ICD-10-CM

## 2022-11-01 NOTE — Telephone Encounter (Signed)
Patient called to see if we had her chest CT results.  Please advise

## 2022-12-01 ENCOUNTER — Ambulatory Visit: Payer: 59 | Admitting: Family Medicine

## 2022-12-11 ENCOUNTER — Encounter: Payer: Self-pay | Admitting: Family Medicine

## 2022-12-11 ENCOUNTER — Ambulatory Visit: Payer: 59 | Admitting: Family Medicine

## 2022-12-11 VITALS — BP 128/78 | HR 90 | Temp 97.7°F | Ht 64.0 in | Wt 171.0 lb

## 2022-12-11 DIAGNOSIS — E039 Hypothyroidism, unspecified: Secondary | ICD-10-CM

## 2022-12-11 DIAGNOSIS — K219 Gastro-esophageal reflux disease without esophagitis: Secondary | ICD-10-CM

## 2022-12-11 DIAGNOSIS — Z23 Encounter for immunization: Secondary | ICD-10-CM | POA: Diagnosis not present

## 2022-12-11 DIAGNOSIS — N951 Menopausal and female climacteric states: Secondary | ICD-10-CM

## 2022-12-11 DIAGNOSIS — E782 Mixed hyperlipidemia: Secondary | ICD-10-CM

## 2022-12-11 DIAGNOSIS — R7303 Prediabetes: Secondary | ICD-10-CM | POA: Diagnosis not present

## 2022-12-11 MED ORDER — SYNTHROID 50 MCG PO TABS: 50.0000 ug | ORAL_TABLET | Freq: Every day | ORAL | 1 refills | Status: DC

## 2022-12-11 MED ORDER — VEOZAH 45 MG PO TABS: 45.0000 mg | ORAL_TABLET | Freq: Every day | ORAL | 3 refills | Status: AC

## 2022-12-11 MED ORDER — SYNTHROID 50 MCG PO TABS: 50.0000 ug | ORAL_TABLET | Freq: Every day | ORAL | 0 refills | Status: DC

## 2022-12-11 NOTE — Assessment & Plan Note (Signed)
Recommend continue to work on eating healthy diet and exercise.  

## 2022-12-11 NOTE — Progress Notes (Signed)
Subjective:  Patient ID: Judith Clark, female    DOB: 06/25/1966  Age: 57 y.o. MRN: [id number]  Chief Complaint  Patient presents with   Hyperlipidemia   Hypothyroidism    HPI: Hypothyroidism: on synthroid 50 mcg once daily in am. Brand name working. ON generic caused hair loss and dry skin/itchy skin. Thyroid levels normal in July.   Hyperlipidemia: Stopped taking crestor 5 mg once daily because was feeling severely fatigued.   Prediabetes: A1C 5.9. stopped metformin because was feeling severely fatigued.   Vitamin D deficiency: takes MVI and biotin.   Chronic idiopathic constipation: improved with decaf coffee.   GERD: pepcid 20 mg one daily. Working on [employer].   Menopausal Symptoms: Patient has been taking Veozah 45 mg daily and this has been doing well for the patient, but the cost was $400 for 3 months.      12/11/2022   11:41 AM 08/25/2022    8:23 AM 06/23/2021    8:15 AM 02/22/2021    3:09 PM  Depression screen PHQ 2/9  Decreased Interest 0 0 0 0  Down, Depressed, Hopeless 0 0 0 0  PHQ - 2 Score 0 0 0 0  Altered sleeping    0  Tired, decreased energy    0  Change in appetite    0  Feeling bad or failure about yourself     0  Trouble concentrating    0  Moving slowly or fidgety/restless    0  Suicidal thoughts    0  PHQ-9 Score    0         06/23/2021    8:15 AM 12/11/2022   11:43 AM  Fall Risk  Falls in the past year? 0 0  Was there an injury with Fall? 0 0  Fall Risk Category Calculator 0 0  Fall Risk Category (Retired) Low   (RETIRED) Patient Fall Risk Level Low fall risk   Patient at Risk for Falls Due to No Fall Risks No Fall Risks  Fall risk Follow up Falls evaluation completed Falls evaluation completed      Review of Systems  Constitutional:  Negative for appetite change, fatigue and fever.  HENT:  Negative for congestion, ear pain, sinus pressure and sore throat.   Respiratory:  Negative for cough, chest tightness, shortness of breath and  wheezing.   Cardiovascular:  Negative for chest pain and palpitations.  Gastrointestinal:  Negative for abdominal pain, constipation, diarrhea, nausea and vomiting.  Genitourinary:  Negative for dysuria and hematuria.  Musculoskeletal:  Negative for arthralgias, back pain, joint swelling and myalgias.  Skin:  Negative for rash.  Neurological:  Negative for dizziness, weakness and headaches.  Psychiatric/Behavioral:  Negative for dysphoric mood. The patient is not nervous/anxious.     Current Outpatient Medications on File Prior to Visit  Medication Sig Dispense Refill   acetaminophen (TYLENOL) 500 MG tablet Take 1,000 mg by mouth every 6 (six) hours as needed for mild pain, moderate pain or headache.     albuterol (VENTOLIN HFA) 108 (90 Base) MCG/ACT inhaler SMARTSIG:2 Puff(s) By Mouth Every 4 Hours PRN     diphenhydrAMINE (BENADRYL) 25 mg capsule Take 25 mg by mouth daily as needed.     famotidine (PEPCID) 20 MG tablet Take 1 tablet (20 mg total) by mouth 2 (two) times daily. 30 tablet 0   Multiple Vitamins-Minerals (MULTIVITAMIN ADULT PO) Take 1 tablet by mouth daily.     ondansetron (ZOFRAN) 4 MG tablet Take  1 tablet (4 mg total) by mouth every 8 (eight) hours as needed for nausea or vomiting. 20 tablet 0   Oxymetazoline HCl (NASAL SPRAY NA) Place into the nose.     No current facility-administered medications on file prior to visit.   Past Medical History:  Diagnosis Date   Acute bronchitis due to other specified organisms 10/30/2022   Depression    GERD (gastroesophageal reflux disease)    Hyperlipidemia    Hypothyroidism    Mobitz type 1 second degree atrioventricular block    unknown if Mobitz 1 or 2.   Past Surgical History:  Procedure Laterality Date   ABDOMINAL HYSTERECTOMY     bladder tacking      Family History  Problem Relation Age of Onset   Breast cancer Mother    Hypertension Mother    Diabetes Father    Social History   Socioeconomic History   Marital  status: Married    Spouse name: Not on file   Number of children: Not on file   Years of education: Not on file   Highest education level: Not on file  Occupational History   Occupation: Therapist, sports    Comment: [place]  Tobacco Use   Smoking status: Former    Packs/day: 1.00    Years: 1.00    Total pack years: 1.00    Types: Cigarettes   Smokeless tobacco: Never  Vaping Use   Vaping Use: Never used  Substance and Sexual Activity   Alcohol use: Not Currently   Drug use: Never   Sexual activity: Not Currently    Birth control/protection: Post-menopausal  Other Topics Concern   Not on file  Social History Narrative   Not on file   Social Determinants of Health   Financial Resource Strain: Not on file  Food Insecurity: Not on file  Transportation Needs: Not on file  Physical Activity: Not on file  Stress: Not on file  Social Connections: Not on file    Objective:  BP 128/78 (BP Location: Left Arm, Patient Position: Sitting)   Pulse 90   Temp 97.7 F (36.5 C) (Temporal)   Ht 5' 4"$  (1.626 m)   Wt 171 lb (77.6 kg)   SpO2 97%   BMI 29.35 kg/m      12/11/2022    2:38 PM 10/30/2022    1:51 PM 10/05/2022    9:42 AM  BP/Weight  Systolic BP 0000000 [id number]  Diastolic BP 78 70 80  Wt. (Lbs) 171 168 169  BMI 29.35 kg/m2 28.84 kg/m2 29.01 kg/m2    Physical Exam Vitals reviewed.  Constitutional:      Appearance: Normal appearance. She is normal weight.  HENT:     Right Ear: Tympanic membrane normal.     Left Ear: Tympanic membrane normal.     Nose: Nose normal.     Mouth/Throat:     Pharynx: No oropharyngeal exudate or posterior oropharyngeal erythema.  Neck:     Vascular: No carotid bruit.  Cardiovascular:     Rate and Rhythm: Normal rate and regular rhythm.     Heart sounds: Normal heart sounds.  Pulmonary:     Effort: Pulmonary effort is normal.     Breath sounds: Normal breath sounds.  Abdominal:     General: Abdomen is flat. Bowel sounds are normal.      Palpations: Abdomen is soft.     Tenderness: There is no abdominal tenderness.  Neurological:     Mental Status: She  is alert and oriented to person, place, and time.  Psychiatric:        Mood and Affect: Mood normal.        Behavior: Behavior normal.     Diabetic Foot Exam - Simple   No data filed      Lab Results  Component Value Date   WBC 8.5 08/25/2022   HGB 13.3 08/25/2022   HCT 41.6 08/25/2022   PLT 392 08/25/2022   GLUCOSE 109 (H) 08/25/2022   CHOL 221 (H) 08/25/2022   TRIG 116 08/25/2022   HDL 47 08/25/2022   LDLCALC 153 (H) 08/25/2022   ALT 42 (H) 08/25/2022   AST 32 08/25/2022   NA 139 08/25/2022   K 4.4 08/25/2022   CL 105 08/25/2022   CREATININE 0.93 08/25/2022   BUN 14 08/25/2022   CO2 17 (L) 08/25/2022   TSH 3.110 08/25/2022   INR 1.0 07/19/2007   HGBA1C 5.9 (H) 08/25/2022      Assessment & Plan:    Acquired hypothyroidism Assessment & Plan: Previously well controlled Continue Synthroid at current dose   Orders: -     Synthroid; Take 1 tablet (50 mcg total) by mouth daily before breakfast.  Dispense: 90 tablet; Refill: 1  Prediabetes Assessment & Plan: Recommend continue to work on eating healthy diet and exercise.    Mixed hyperlipidemia Assessment & Plan: Uncontrolled.  Recommend continue to work on eating healthy diet and exercise. Consider alternative statin Return for fasting labs.  Orders: -     CBC With Diff/Platelet; Future -     Comprehensive metabolic panel; Future -     Lipid panel; Future  Menopausal symptoms Assessment & Plan: Continue Vezoah 45 mg daily.  Orders: Willette Pa; Take 1 tablet (45 mg total) by mouth daily.  Dispense: 90 tablet; Refill: 3  Need for vaccination -     Varicella-zoster vaccine IM  Gastroesophageal reflux disease without esophagitis Assessment & Plan: Continue pepcid 20 mg daily.       Meds ordered this encounter  Medications   DISCONTD: SYNTHROID 50 MCG tablet    Sig:  Take 1 tablet (50 mcg total) by mouth daily before breakfast.    Dispense:  30 tablet    Refill:  0    BRAND NAME MEDICALLY NECESSARY.   SYNTHROID 50 MCG tablet    Sig: Take 1 tablet (50 mcg total) by mouth daily before breakfast.    Dispense:  90 tablet    Refill:  1    BRAND NAME MEDICALLY NECESSARY.   Fezolinetant (VEOZAH) 45 MG TABS    Sig: Take 1 tablet (45 mg total) by mouth daily.    Dispense:  90 tablet    Refill:  3    Orders Placed This Encounter  Procedures   Zoster Recombinant (Shingrix )   CBC With Diff/Platelet   Comprehensive metabolic panel   Lipid panel     Follow-up: Return in about 3 months (around 03/11/2023) for chronic fasting.   I,Lauren M Auman,acting as a scribe for Rochel Brome, MD.,have documented all relevant documentation on the behalf of Rochel Brome, MD,as directed by  Rochel Brome, MD while in the presence of Rochel Brome, MD.   An After Visit Summary was printed and given to the patient.  I attest that I have reviewed this visit and agree with the plan scribed by my staff.   Rochel Brome, MD [facility] [phone]

## 2022-12-11 NOTE — Assessment & Plan Note (Signed)
Patient had Allergic reaction to Rosuvastatin. Patient is sensitive to medications due to certain red dyes that are in them. Continue to work on eating a healthy diet and exercise.  Labs drawn today.

## 2022-12-11 NOTE — Assessment & Plan Note (Signed)
Continue pepcid 20 mg daily.

## 2022-12-11 NOTE — Assessment & Plan Note (Signed)
Uncontrolled.  Recommend continue to work on eating healthy diet and exercise. Consider alternative statin Return for fasting labs.

## 2022-12-11 NOTE — Assessment & Plan Note (Signed)
Continue Vezoah 45 mg daily.

## 2022-12-11 NOTE — Assessment & Plan Note (Signed)
Previously well controlled Continue Synthroid at current dose

## 2022-12-12 ENCOUNTER — Other Ambulatory Visit: Payer: Self-pay

## 2022-12-12 ENCOUNTER — Other Ambulatory Visit: Payer: 59

## 2022-12-12 DIAGNOSIS — E782 Mixed hyperlipidemia: Secondary | ICD-10-CM

## 2022-12-13 LAB — CBC WITH DIFF/PLATELET
Basophils Absolute: 0.1 10*3/uL (ref 0.0–0.2)
Basos: 0 %
EOS (ABSOLUTE): 0.2 10*3/uL (ref 0.0–0.4)
Eos: 2 %
Hematocrit: 40 % (ref 34.0–46.6)
Hemoglobin: 13.1 g/dL (ref 11.1–15.9)
Immature Grans (Abs): 0 10*3/uL (ref 0.0–0.1)
Immature Granulocytes: 0 %
Lymphocytes Absolute: 2.9 10*3/uL (ref 0.7–3.1)
Lymphs: 24 %
MCH: 27.7 pg (ref 26.6–33.0)
MCHC: 32.8 g/dL (ref 31.5–35.7)
MCV: 85 fL (ref 79–97)
Monocytes Absolute: 0.8 10*3/uL (ref 0.1–0.9)
Monocytes: 7 %
Neutrophils Absolute: 8.2 10*3/uL — ABNORMAL HIGH (ref 1.4–7.0)
Neutrophils: 67 %
Platelets: 338 10*3/uL (ref 150–450)
RBC: 4.73 x10E6/uL (ref 3.77–5.28)
RDW: 14.2 % (ref 11.7–15.4)
WBC: 12.1 10*3/uL — ABNORMAL HIGH (ref 3.4–10.8)

## 2022-12-13 LAB — LIPID PANEL
Chol/HDL Ratio: 3.6 ratio (ref 0.0–4.4)
Cholesterol, Total: 183 mg/dL (ref 100–199)
HDL: 51 mg/dL (ref >39)
LDL Chol Calc (NIH): 118 mg/dL — ABNORMAL HIGH (ref 0–99)
Triglycerides: 76 mg/dL (ref 0–149)
VLDL Cholesterol Cal: 14 mg/dL (ref 5–40)

## 2022-12-13 LAB — COMPREHENSIVE METABOLIC PANEL
ALT: 46 IU/L — ABNORMAL HIGH (ref 0–32)
AST: 23 IU/L (ref 0–40)
Albumin/Globulin Ratio: 1.8 (ref 1.2–2.2)
Albumin: 4.2 g/dL (ref 3.8–4.9)
Alkaline Phosphatase: 99 IU/L (ref 44–121)
BUN/Creatinine Ratio: 15 (ref 9–23)
BUN: 17 mg/dL (ref 6–24)
Bilirubin Total: 0.4 mg/dL (ref 0.0–1.2)
CO2: 18 mmol/L — ABNORMAL LOW (ref 20–29)
Calcium: 9 mg/dL (ref 8.7–10.2)
Chloride: 103 mmol/L (ref 96–106)
Creatinine, Ser: 1.1 mg/dL — ABNORMAL HIGH (ref 0.57–1.00)
Globulin, Total: 2.4 g/dL (ref 1.5–4.5)
Glucose: 99 mg/dL (ref 70–99)
Potassium: 4.6 mmol/L (ref 3.5–5.2)
Sodium: 137 mmol/L (ref 134–144)
Total Protein: 6.6 g/dL (ref 6.0–8.5)
eGFR: 59 mL/min/{1.73_m2} — ABNORMAL LOW (ref >59)

## 2022-12-13 LAB — CARDIOVASCULAR RISK ASSESSMENT

## 2022-12-14 NOTE — Progress Notes (Signed)
Blood count abnormal. Wbc little high. Liver function : one enzyme little up.  Kidney function abnormal. Recommend hydrate.  Cholesterol: LDL improved. Not at goal of less than 100 yet. Intolerant to crestor. Recommend continue to work on low fat diet and exercise.  Repeat in lipids in 3 months.  Recommend follow up in 2 weeks for cbc, cmp.

## 2022-12-18 ENCOUNTER — Other Ambulatory Visit: Payer: Self-pay

## 2022-12-18 DIAGNOSIS — R7989 Other specified abnormal findings of blood chemistry: Secondary | ICD-10-CM

## 2022-12-18 DIAGNOSIS — N289 Disorder of kidney and ureter, unspecified: Secondary | ICD-10-CM

## 2022-12-18 DIAGNOSIS — R945 Abnormal results of liver function studies: Secondary | ICD-10-CM

## 2022-12-27 ENCOUNTER — Other Ambulatory Visit: Payer: 59

## 2022-12-27 ENCOUNTER — Telehealth: Payer: Self-pay

## 2022-12-27 DIAGNOSIS — R7989 Other specified abnormal findings of blood chemistry: Secondary | ICD-10-CM

## 2022-12-27 DIAGNOSIS — R945 Abnormal results of liver function studies: Secondary | ICD-10-CM

## 2022-12-27 NOTE — Telephone Encounter (Signed)
Patient states she needed samples of veozah and wanted [place] to know she has lost her taste and smell which has been going on for over 8 days. States this has happened before feels she has covid but has been tested twice alog with the flu and is negative. Asked that it be documented in her chart.

## 2022-12-28 LAB — CBC WITH DIFFERENTIAL/PLATELET
Basophils Absolute: 0 10*3/uL (ref 0.0–0.2)
Basos: 0 %
EOS (ABSOLUTE): 0.1 10*3/uL (ref 0.0–0.4)
Eos: 1 %
Hematocrit: 39.5 % (ref 34.0–46.6)
Hemoglobin: 13 g/dL (ref 11.1–15.9)
Immature Grans (Abs): 0 10*3/uL (ref 0.0–0.1)
Immature Granulocytes: 0 %
Lymphocytes Absolute: 3.6 10*3/uL — ABNORMAL HIGH (ref 0.7–3.1)
Lymphs: 39 %
MCH: 27.6 pg (ref 26.6–33.0)
MCHC: 32.9 g/dL (ref 31.5–35.7)
MCV: 84 fL (ref 79–97)
Monocytes Absolute: 0.5 10*3/uL (ref 0.1–0.9)
Monocytes: 6 %
Neutrophils Absolute: 4.9 10*3/uL (ref 1.4–7.0)
Neutrophils: 54 %
Platelets: 389 10*3/uL (ref 150–450)
RBC: 4.71 x10E6/uL (ref 3.77–5.28)
RDW: 13.9 % (ref 11.7–15.4)
WBC: 9.2 10*3/uL (ref 3.4–10.8)

## 2022-12-28 LAB — COMPREHENSIVE METABOLIC PANEL
ALT: 36 IU/L — ABNORMAL HIGH (ref 0–32)
AST: 22 IU/L (ref 0–40)
Albumin/Globulin Ratio: 1.8 (ref 1.2–2.2)
Albumin: 4.3 g/dL (ref 3.8–4.9)
Alkaline Phosphatase: 98 IU/L (ref 44–121)
BUN/Creatinine Ratio: 13 (ref 9–23)
BUN: 13 mg/dL (ref 6–24)
Bilirubin Total: 0.2 mg/dL (ref 0.0–1.2)
CO2: 18 mmol/L — ABNORMAL LOW (ref 20–29)
Calcium: 9 mg/dL (ref 8.7–10.2)
Chloride: 107 mmol/L — ABNORMAL HIGH (ref 96–106)
Creatinine, Ser: 0.99 mg/dL (ref 0.57–1.00)
Globulin, Total: 2.4 g/dL (ref 1.5–4.5)
Glucose: 110 mg/dL — ABNORMAL HIGH (ref 70–99)
Potassium: 4.3 mmol/L (ref 3.5–5.2)
Sodium: 141 mmol/L (ref 134–144)
Total Protein: 6.7 g/dL (ref 6.0–8.5)
eGFR: 67 mL/min/{1.73_m2} (ref >59)

## 2023-01-08 ENCOUNTER — Other Ambulatory Visit: Payer: Self-pay | Admitting: Family Medicine

## 2023-01-08 DIAGNOSIS — E039 Hypothyroidism, unspecified: Secondary | ICD-10-CM

## 2023-03-22 ENCOUNTER — Ambulatory Visit: Payer: 59 | Admitting: Family Medicine

## 2023-07-11 LAB — HM HIV SCREENING LAB: HM HIV Screening: NEGATIVE

## 2023-07-20 LAB — HEMOGLOBIN A1C: Hemoglobin A1C: 6
